# Patient Record
Sex: Female | Born: 1965 | Race: White | Hispanic: No | Marital: Married | Smoking: Former smoker
Health system: Southern US, Community
[De-identification: ages and names within clinical notes are randomized; demographics above are authoritative.]

## PROBLEM LIST (undated history)

## (undated) DIAGNOSIS — R55 Syncope and collapse: Secondary | ICD-10-CM

## (undated) DIAGNOSIS — F329 Major depressive disorder, single episode, unspecified: Secondary | ICD-10-CM

## (undated) DIAGNOSIS — E611 Iron deficiency: Secondary | ICD-10-CM

## (undated) DIAGNOSIS — M5412 Radiculopathy, cervical region: Secondary | ICD-10-CM

## (undated) DIAGNOSIS — R197 Diarrhea, unspecified: Secondary | ICD-10-CM

## (undated) DIAGNOSIS — M549 Dorsalgia, unspecified: Principal | ICD-10-CM

## (undated) DIAGNOSIS — Z86010 Personal history of colonic polyps: Secondary | ICD-10-CM

## (undated) DIAGNOSIS — K219 Gastro-esophageal reflux disease without esophagitis: Secondary | ICD-10-CM

## (undated) DIAGNOSIS — F32A Depression, unspecified: Secondary | ICD-10-CM

## (undated) DIAGNOSIS — R220 Localized swelling, mass and lump, head: Secondary | ICD-10-CM

## (undated) DIAGNOSIS — T7840XA Allergy, unspecified, initial encounter: Secondary | ICD-10-CM

## (undated) DIAGNOSIS — Z8719 Personal history of other diseases of the digestive system: Secondary | ICD-10-CM

## (undated) HISTORY — DX: Localized swelling, mass and lump, head: R22.0

## (undated) HISTORY — DX: Gastro-esophageal reflux disease without esophagitis: K21.9

## (undated) HISTORY — DX: Dorsalgia, unspecified: M54.9

## (undated) HISTORY — PX: COLONOSCOPY: SHX174

## (undated) HISTORY — PX: ABDOMINAL HYSTERECTOMY: SHX81

## (undated) HISTORY — PX: OTHER SURGICAL HISTORY: SHX169

## (undated) HISTORY — DX: Diarrhea, unspecified: R19.7

## (undated) HISTORY — DX: Personal history of colonic polyps: Z86.010

## (undated) HISTORY — DX: Personal history of other diseases of the digestive system: Z87.19

## (undated) HISTORY — DX: Iron deficiency: E61.1

## (undated) HISTORY — DX: Allergy, unspecified, initial encounter: T78.40XA

## (undated) HISTORY — DX: Major depressive disorder, single episode, unspecified: F32.9

## (undated) HISTORY — DX: Depression, unspecified: F32.A

## (undated) HISTORY — DX: Syncope and collapse: R55

## (undated) HISTORY — DX: Radiculopathy, cervical region: M54.12

---

## 2002-12-04 ENCOUNTER — Other Ambulatory Visit: Admission: RE | Admit: 2002-12-04 | Discharge: 2002-12-04 | Payer: Self-pay | Admitting: Obstetrics and Gynecology

## 2004-04-28 ENCOUNTER — Ambulatory Visit: Payer: Self-pay | Admitting: Family Medicine

## 2004-06-03 ENCOUNTER — Ambulatory Visit: Payer: Self-pay | Admitting: Family Medicine

## 2004-06-18 ENCOUNTER — Encounter: Admission: RE | Admit: 2004-06-18 | Discharge: 2004-06-18 | Payer: Self-pay | Admitting: Family Medicine

## 2004-07-29 ENCOUNTER — Ambulatory Visit: Payer: Self-pay | Admitting: Family Medicine

## 2005-01-06 ENCOUNTER — Ambulatory Visit: Payer: Self-pay | Admitting: Family Medicine

## 2005-05-10 ENCOUNTER — Ambulatory Visit: Payer: Self-pay | Admitting: Family Medicine

## 2005-07-29 ENCOUNTER — Other Ambulatory Visit: Admission: RE | Admit: 2005-07-29 | Discharge: 2005-07-29 | Payer: Self-pay | Admitting: Obstetrics and Gynecology

## 2005-12-26 ENCOUNTER — Ambulatory Visit: Payer: Self-pay | Admitting: Internal Medicine

## 2005-12-27 ENCOUNTER — Ambulatory Visit: Payer: Self-pay | Admitting: Family Medicine

## 2006-01-09 ENCOUNTER — Ambulatory Visit: Payer: Self-pay | Admitting: Family Medicine

## 2006-08-05 ENCOUNTER — Emergency Department (HOSPITAL_COMMUNITY): Admission: EM | Admit: 2006-08-05 | Discharge: 2006-08-05 | Payer: Self-pay | Admitting: Emergency Medicine

## 2007-01-17 ENCOUNTER — Telehealth: Payer: Self-pay | Admitting: Family Medicine

## 2007-01-17 ENCOUNTER — Ambulatory Visit: Payer: Self-pay | Admitting: Family Medicine

## 2007-01-17 DIAGNOSIS — R519 Headache, unspecified: Secondary | ICD-10-CM | POA: Insufficient documentation

## 2007-01-17 DIAGNOSIS — R002 Palpitations: Secondary | ICD-10-CM | POA: Insufficient documentation

## 2007-01-17 DIAGNOSIS — R51 Headache: Secondary | ICD-10-CM | POA: Insufficient documentation

## 2007-09-12 ENCOUNTER — Encounter: Admission: RE | Admit: 2007-09-12 | Discharge: 2007-09-12 | Payer: Self-pay | Admitting: Obstetrics and Gynecology

## 2007-09-24 ENCOUNTER — Ambulatory Visit: Payer: Self-pay | Admitting: Internal Medicine

## 2007-09-24 LAB — CONVERTED CEMR LAB
ALT: 18 units/L (ref 0–35)
BUN: 13 mg/dL (ref 6–23)
Basophils Absolute: 0 10*3/uL (ref 0.0–0.1)
CO2: 30 meq/L (ref 19–32)
Calcium: 9.4 mg/dL (ref 8.4–10.5)
Chloride: 107 meq/L (ref 96–112)
Cholesterol: 147 mg/dL (ref 0–200)
GFR calc Af Amer: 90 mL/min
Glucose, Bld: 104 mg/dL — ABNORMAL HIGH (ref 70–99)
HCT: 36.1 % (ref 36.0–46.0)
HDL: 36.5 mg/dL — ABNORMAL LOW (ref >39.0)
Hemoglobin: 12.5 g/dL (ref 12.0–15.0)
Ketones, ur: NEGATIVE mg/dL
Lymphocytes Relative: 27.1 % (ref 12.0–46.0)
MCHC: 34.5 g/dL (ref 30.0–36.0)
Potassium: 4 meq/L (ref 3.5–5.1)
RDW: 11.6 % (ref 11.5–14.6)
Sodium: 142 meq/L (ref 135–145)
Specific Gravity, Urine: 1.025 (ref 1.000–1.03)
TSH: 1.34 microintl units/mL (ref 0.35–5.50)
Total Bilirubin: 0.5 mg/dL (ref 0.3–1.2)
Urine Glucose: NEGATIVE mg/dL

## 2007-09-28 ENCOUNTER — Ambulatory Visit: Payer: Self-pay | Admitting: Internal Medicine

## 2007-09-28 DIAGNOSIS — Z860100 Personal history of colon polyps, unspecified: Secondary | ICD-10-CM | POA: Insufficient documentation

## 2007-09-28 DIAGNOSIS — R197 Diarrhea, unspecified: Secondary | ICD-10-CM

## 2007-09-28 DIAGNOSIS — L989 Disorder of the skin and subcutaneous tissue, unspecified: Secondary | ICD-10-CM | POA: Insufficient documentation

## 2007-09-28 HISTORY — DX: Personal history of colonic polyps: Z86.010

## 2007-09-28 HISTORY — DX: Diarrhea, unspecified: R19.7

## 2007-10-01 ENCOUNTER — Encounter (INDEPENDENT_AMBULATORY_CARE_PROVIDER_SITE_OTHER): Payer: Self-pay | Admitting: *Deleted

## 2007-10-08 ENCOUNTER — Ambulatory Visit: Payer: Self-pay | Admitting: Gastroenterology

## 2007-10-08 LAB — CONVERTED CEMR LAB: IgA: 254 mg/dL (ref 68–378)

## 2007-10-10 LAB — CONVERTED CEMR LAB: Tissue Transglutaminase Ab, IgA: 0.2 units (ref <7)

## 2007-10-15 ENCOUNTER — Ambulatory Visit: Payer: Self-pay | Admitting: Gastroenterology

## 2007-10-15 ENCOUNTER — Encounter: Payer: Self-pay | Admitting: Gastroenterology

## 2007-10-16 ENCOUNTER — Encounter: Payer: Self-pay | Admitting: Gastroenterology

## 2007-11-21 ENCOUNTER — Ambulatory Visit: Payer: Self-pay | Admitting: Gastroenterology

## 2007-11-30 ENCOUNTER — Telehealth: Payer: Self-pay | Admitting: Gastroenterology

## 2007-12-04 ENCOUNTER — Telehealth (INDEPENDENT_AMBULATORY_CARE_PROVIDER_SITE_OTHER): Payer: Self-pay | Admitting: *Deleted

## 2007-12-28 ENCOUNTER — Ambulatory Visit: Payer: Self-pay | Admitting: Internal Medicine

## 2008-06-12 HISTORY — PX: OTHER SURGICAL HISTORY: SHX169

## 2008-06-19 ENCOUNTER — Encounter (INDEPENDENT_AMBULATORY_CARE_PROVIDER_SITE_OTHER): Payer: Self-pay | Admitting: General Surgery

## 2008-06-19 ENCOUNTER — Ambulatory Visit (HOSPITAL_COMMUNITY): Admission: RE | Admit: 2008-06-19 | Discharge: 2008-06-19 | Payer: Self-pay | Admitting: General Surgery

## 2008-09-12 ENCOUNTER — Encounter: Admission: RE | Admit: 2008-09-12 | Discharge: 2008-09-12 | Payer: Self-pay | Admitting: Obstetrics and Gynecology

## 2008-10-06 ENCOUNTER — Ambulatory Visit: Payer: Self-pay | Admitting: Internal Medicine

## 2008-10-06 LAB — CONVERTED CEMR LAB
ALT: 15 units/L (ref 0–35)
AST: 18 units/L (ref 0–37)
Albumin: 3.4 g/dL — ABNORMAL LOW (ref 3.5–5.2)
Alkaline Phosphatase: 38 units/L — ABNORMAL LOW (ref 39–117)
Basophils Relative: 0.4 % (ref 0.0–3.0)
Bilirubin Urine: NEGATIVE
CO2: 26 meq/L (ref 19–32)
Creatinine, Ser: 0.9 mg/dL (ref 0.4–1.2)
Eosinophils Absolute: 0.1 10*3/uL (ref 0.0–0.7)
Eosinophils Relative: 0.7 % (ref 0.0–5.0)
GFR calc non Af Amer: 73.35 mL/min (ref >60)
Glucose, Bld: 99 mg/dL (ref 70–99)
HCT: 36.6 % (ref 36.0–46.0)
Hemoglobin, Urine: NEGATIVE
Hemoglobin: 12 g/dL (ref 12.0–15.0)
Ketones, ur: NEGATIVE mg/dL
LDL Cholesterol: 83 mg/dL (ref 0–99)
Leukocytes, UA: NEGATIVE
Neutro Abs: 5.2 10*3/uL (ref 1.4–7.7)
Platelets: 277 10*3/uL (ref 150.0–400.0)
Potassium: 4.2 meq/L (ref 3.5–5.1)
Specific Gravity, Urine: 1.025 (ref 1.000–1.030)
TSH: 1.37 microintl units/mL (ref 0.35–5.50)
Total CHOL/HDL Ratio: 4
Total Protein: 6.8 g/dL (ref 6.0–8.3)
Triglycerides: 111 mg/dL (ref 0.0–149.0)
Urobilinogen, UA: 0.2 (ref 0.0–1.0)

## 2008-10-08 ENCOUNTER — Ambulatory Visit: Payer: Self-pay | Admitting: Internal Medicine

## 2009-05-25 ENCOUNTER — Ambulatory Visit: Payer: Self-pay | Admitting: Internal Medicine

## 2009-05-25 DIAGNOSIS — M5412 Radiculopathy, cervical region: Secondary | ICD-10-CM

## 2009-05-25 HISTORY — DX: Radiculopathy, cervical region: M54.12

## 2009-09-15 ENCOUNTER — Encounter: Admission: RE | Admit: 2009-09-15 | Discharge: 2009-09-15 | Payer: Self-pay | Admitting: Obstetrics and Gynecology

## 2009-10-06 ENCOUNTER — Ambulatory Visit: Payer: Self-pay | Admitting: Internal Medicine

## 2009-10-06 LAB — CONVERTED CEMR LAB
ALT: 34 units/L (ref 0–35)
AST: 25 units/L (ref 0–37)
Bilirubin, Direct: 0.1 mg/dL (ref 0.0–0.3)
Hemoglobin, Urine: NEGATIVE
Ketones, ur: NEGATIVE mg/dL
Specific Gravity, Urine: 1.02 (ref 1.000–1.030)
Total Bilirubin: 0.7 mg/dL (ref 0.3–1.2)
Total Protein, Urine: NEGATIVE mg/dL
Total Protein: 7.5 g/dL (ref 6.0–8.3)
Urine Glucose: NEGATIVE mg/dL
Urobilinogen, UA: 0.2 (ref 0.0–1.0)

## 2009-10-09 ENCOUNTER — Encounter: Payer: Self-pay | Admitting: Internal Medicine

## 2009-10-09 ENCOUNTER — Ambulatory Visit: Payer: Self-pay | Admitting: Internal Medicine

## 2009-10-13 ENCOUNTER — Ambulatory Visit (HOSPITAL_COMMUNITY): Admission: RE | Admit: 2009-10-13 | Discharge: 2009-10-14 | Payer: Self-pay | Admitting: Obstetrics and Gynecology

## 2009-10-13 ENCOUNTER — Encounter (INDEPENDENT_AMBULATORY_CARE_PROVIDER_SITE_OTHER): Payer: Self-pay | Admitting: Obstetrics and Gynecology

## 2009-12-26 ENCOUNTER — Ambulatory Visit: Payer: Self-pay | Admitting: Family Medicine

## 2010-02-16 ENCOUNTER — Ambulatory Visit: Payer: Self-pay | Admitting: Internal Medicine

## 2010-02-16 DIAGNOSIS — J019 Acute sinusitis, unspecified: Secondary | ICD-10-CM

## 2010-03-09 ENCOUNTER — Ambulatory Visit
Admission: RE | Admit: 2010-03-09 | Discharge: 2010-03-09 | Payer: Self-pay | Source: Home / Self Care | Attending: Internal Medicine | Admitting: Internal Medicine

## 2010-03-09 DIAGNOSIS — H6690 Otitis media, unspecified, unspecified ear: Secondary | ICD-10-CM | POA: Insufficient documentation

## 2010-03-09 DIAGNOSIS — H6980 Other specified disorders of Eustachian tube, unspecified ear: Secondary | ICD-10-CM

## 2010-04-13 NOTE — Assessment & Plan Note (Signed)
Summary: PHYSICAL  STC   Vital Signs:  Patient profile:   45 year old female Height:      62 inches Weight:      139 pounds BMI:     25.52 O2 Sat:      97 % on Room air Temp:     98.2 degrees F oral Pulse rate:   75 / minute BP sitting:   90 / 68  (left arm) Cuff size:   regular  Vitals Entered By: Zella Ball Ewing CMA Duncan Dull) (October 09, 2009 8:58 AM)  O2 Flow:  Room air  Preventive Care Screening  Mammogram:    Date:  09/15/2009    Results:  normal   CC: Adult Physical/RE   Primary Care Provider:  Oliver Barre, MD  CC:  Adult Physical/RE.  History of Present Illness: here to f/u - for vaginal hyst next wk due to fibroids;  Pt denies CP, sob, doe, wheezing, orthopnea, pnd, worsening LE edema, palps, dizziness or syncope  Pt denies new neuro symptoms such as headache, facial or extremity weakness  No fever, wt loss, night sweats, loss of appetite or other constitutional symptoms   Problems Prior to Update: 1)  Cervical Radiculopathy  (ICD-723.4) 2)  Skin Lesion  (ICD-709.9) 3)  Preventive Health Care  (ICD-V70.0) 4)  Diarrhea  (ICD-787.91) 5)  Colonic Polyps, Hx of  (ICD-V12.72) 6)  Palpitations  (ICD-785.1) 7)  Headache, Sinus  (ICD-784.0)  Medications Prior to Update: 1)  Colestid 1 Gm  Tabs (Colestipol Hcl) .... Take One Pill, Once Daily 2)  Azithromycin 250 Mg Tabs (Azithromycin) .... 2po Qd For 1 Day, Then 1po Qd For 4days, Then Stop 3)  Promethazine-Codeine 6.25-10 Mg/25ml Syrp (Promethazine-Codeine) .Marland Kitchen.. 1 Tsp By Mouth Q 6 Hrs As Needed Cough 4)  Dairy Digestive Supplement 3000 Unit Tabs (Lactase) .Marland Kitchen.. 1 By Mouth As Needed  Current Medications (verified): 1)  Colestid 1 Gm  Tabs (Colestipol Hcl) .... Take One Pill, Once Daily 2)  Dairy Digestive Supplement 3000 Unit Tabs (Lactase) .Marland Kitchen.. 1 By Mouth As Needed  Allergies (verified): No Known Drug Allergies  Past History:  Past Medical History: Last updated: 10/08/2007 Colonic polyps, hx of  chronic recurrent  diarrhea - ? 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         all otherwise negative per pt -    Physical Exam  General:  alert and well-developed.   Head:  normocephalic and atraumatic.   Eyes:  vision grossly intact, pupils equal, and pupils round.   Ears:  R ear  normal and L ear normal.   Nose:  no external deformity and no nasal discharge.   Mouth:  no gingival abnormalities and pharynx pink and moist.   Neck:  supple and no masses.   Lungs:  normal respiratory effort and normal breath sounds.   Heart:  normal rate and regular rhythm.   Abdomen:  soft, non-tender, and normal bowel sounds.   Msk:  no joint tenderness and no joint swelling.   Extremities:  no edema, no erythema  Neurologic:  cranial nerves II-XII intact and strength normal in all extremities.     Skin:  color normal and no rashes.   Psych:  not anxious appearing and not depressed appearing.     Impression & Recommendations:  Problem # 1:  Preventive Health Care (ICD-V70.0)  Overall doing well, age appropriate education and counseling updated and referral for appropriate preventive services done unless declined, immunizations up to date or declined, diet counseling done if overweight, urged to quit smoking if smokes , most recent labs reviewed and current ordered if appropriate, ecg reviewed or declined (interpretation per ECG scanned in the EMR if done); information regarding Medicare Prevention requirements given if appropriate; speciality referrals updated as appropriate   Orders: EKG w/ Interpretation (93000)  Complete Medication List: 1)  Colestid 1 Gm Tabs (Colestipol hcl) .... Take one pill, once daily 2)  Dairy Digestive Supplement 3000 Unit Tabs (Lactase) .Marland Kitchen.. 1 by mouth as needed  Patient Instructions: 1)  Continue all previous medications as before this visit  2)  You are given the copy of your EKG and lab work 3)  Daisey Must with your surgury 4)  Please schedule a follow-up appointment in 1 year

## 2010-04-13 NOTE — Assessment & Plan Note (Signed)
Summary: cough/sore throat/john/cd   Vital Signs:  Patient profile:   45 year old female Weight:      145 pounds Temp:     99.1 degrees F oral Pulse rate:   68 / minute Pulse rhythm:   regular BP sitting:   104 / 76  (right arm) Cuff size:   regular  Vitals Entered By: Lowella Petties CMA (December 26, 2009 9:10 AM) CC: Cough, sore throat, congestion x 1 week   Primary Care Provider:  Oliver Barre, MD   History of Present Illness:  45 year old female with a productive cough, subjective feeling of fever, chills,  cough productive of colored sputum, sore throat, that is unresolved in  7-10 days. No nausea, vomiting, or diarrhea. Severe cough, keeping her up at night. She does work in [employer] of one of the [employer].  No smoking. No UTI symptoms. No rash.  ROS: As above, no genitourinary complaints or gynecological complaints.  No chest pain. No polyuria or dysuria.  GEN: A and O x 3. WDWN. NAD.    ENT: Nose clear, ext NML.  No LAD.  No JVD.  TM's clear. Oropharynx clear.  PULM: Normal WOB, no distress. No crackles, wheezes, rhonchi. CV: RRR, no M/G/R, No rubs, No JVD.   ABD: S, NT, ND, + BS. No rebound. No guarding. No HSM.   EXT: warm and well-perfused, No c/c/e. PSYCH: Pleasant and conversant.   Allergies: No Known Drug Allergies  Past History:  Past medical, surgical, family and social histories (including risk factors) reviewed, and no changes noted (except as noted below).  Past Medical History: Reviewed history from 10/08/2007 and no changes required. Colonic polyps, hx of  chronic recurrent diarrhea - ? etiology  Past Surgical History: Reviewed history from 10/08/2008 and no changes required. 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 mother with GYN cancer 3 aunt and grandmother with breast cancer grandmother with CAD/MI No FH of Colon Cancer:  Social  History: Reviewed history from 10/09/2009 and no changes required. 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 1 - 2 tsp by mouth at bedtime as needed cough    Azithromycin 250 Mg Tabs (Azithromycin) .Marland Kitchen... 2 by  mouth today and then 1 daily for 4 days  Problem # 2:  COUGH (ICD-786.2)  Complete Medication List: 1)  Colestid 1 Gm Tabs (Colestipol hcl) .... Take one pill, once daily 2)  Dairy Digestive Supplement 3000 Unit Tabs (Lactase) .Marland Kitchen.. 1 by mouth as needed 3)  Hydrocodone-homatropine 5-1.5 Mg Tabs (Hydrocodone-homatropine) .Marland Kitchen.. 1 - 2 tsp by mouth at bedtime as needed cough 4)  Azithromycin 250 Mg Tabs (Azithromycin) .... 2 by  mouth today and then 1 daily for 4 days  Patient Instructions: 1)  BRONCHITIS 2)  -Viral or baterial infections of the lung. Fever, cough, chest pain, shortness of breath, phlegm production, fatigue are symptoms. 3)  Treatment: 4)  1. Take all medicines 5)  2. Antibiotics  6)  3. Cough suppressants 7)  4. Bronchodilators: an inhaler 8)  5. Expectorant like Guaifenesin (Robitussin, Mucinex) 9)  Fluids and Moisture help: drink lots of fluids 10)  Vaporizier or humidifier in room, shower steam 11)  --help loosen secretions and sooth breathing passages 12)  Elevate head slightly when trying to sleep.  Prescriptions: AZITHROMYCIN 250 MG  TABS (AZITHROMYCIN) 2 by  mouth today and then 1 daily for 4 days  #6 x 0   Entered and Authorized by:   Hannah Beat MD   Signed by:   Hannah Beat MD on 12/26/2009   Method used:   Print then Give to Patient   RxID:    [id number] HYDROCODONE-HOMATROPINE 5-1.5 MG TABS (HYDROCODONE-HOMATROPINE) 1 - 2 tsp by mouth at bedtime as needed cough  #240 mL x 0   Entered and Authorized by:   Hannah Beat MD   Signed by:   Hannah Beat MD on 12/26/2009   Method used:   Print then Give to Patient   RxID:   [id number]    Orders Added: 1)  Est. Patient Level IV [84696]

## 2010-04-13 NOTE — Assessment & Plan Note (Signed)
Summary: PLACE ON FACE/NWS  #   Vital Signs:  Patient profile:   45 year old female Height:      62 inches Weight:      151.25 pounds BMI:     27.76 O2 Sat:      98 % on Room air Temp:     98.5 degrees F oral Pulse rate:   70 / minute BP sitting:   102 / 72  (left arm) Cuff size:   regular  Vitals Entered ByZella Ball Ewing (May 25, 2009 4:03 PM)  O2 Flow:  Room air CC: spot on face/RE   Primary Care Provider:  Oliver Barre, MD  CC:  spot on face/RE.  History of Present Illness: here with a new lesion to the area of the face just in front of the right tragus which seems to have come up in the past  2 months and a couple of co-workers have had facial skin cancers removed recently;   also c/o one episode right neck pain with radiation asssoc with spasm like pain to the distal RUE with numbness and weakness that lasted for a minute only a few months ago, that has not recurred; was quite dramatic to her and occurred while eating using the right hand lifting the fork to the mouth and flexing the neck;  has not recurred since.  Problems Prior to Update: 1)  Skin Lesion  (ICD-709.9) 2)  Preventive Health Care  (ICD-V70.0) 3)  Diarrhea  (ICD-787.91) 4)  Colonic Polyps, Hx of  (ICD-V12.72) 5)  Palpitations  (ICD-785.1) 6)  Headache, Sinus  (ICD-784.0)  Medications Prior to Update: 1)  Colestid 1 Gm  Tabs (Colestipol Hcl) .... Take One Pill, Once Daily 2)  Azithromycin 250 Mg Tabs (Azithromycin) .... 2po Qd For 1 Day, Then 1po Qd For 4days, Then Stop 3)  Promethazine-Codeine 6.25-10 Mg/38ml Syrp (Promethazine-Codeine) .Marland Kitchen.. 1 Tsp By Mouth Q 6 Hrs As Needed Cough 4)  Dairy Digestive Supplement 3000 Unit Tabs (Lactase) .Marland Kitchen.. 1 By Mouth As Needed  Current Medications (verified): 1)  Colestid 1 Gm  Tabs (Colestipol Hcl) .... Take One Pill, Once Daily 2)  Azithromycin 250 Mg Tabs (Azithromycin) .... 2po Qd For 1 Day, Then 1po Qd For 4days, Then Stop 3)  Promethazine-Codeine 6.25-10 Mg/82ml  Syrp (Promethazine-Codeine) .Marland Kitchen.. 1 Tsp By Mouth Q 6 Hrs As Needed Cough 4)  Dairy Digestive Supplement 3000 Unit Tabs (Lactase) .Marland Kitchen.. 1 By Mouth As Needed  Allergies (verified): No Known Drug Allergies  Past History:  Past Medical History: Last updated: 10/08/2007 Colonic polyps, hx of  chronic recurrent diarrhea - ? 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   Head:  normocephalic and atraumatic.   Eyes:  vision grossly intact, pupils equal, and pupils round.   Ears:  R ear normal and L ear normal.   Nose:  no external deformity and no nasal discharge.   Mouth:  no gingival abnormalities and pharynx pink and moist.   Neck:  supple and no masses.   Lungs:  normal respiratory effort and normal breath sounds.   Heart:  normal rate  and regular rhythm.   Extremities:  no edema, no erythema  Neurologic:  cranial nerves II-XII intact and strength normal in upper extremities.   Skin:  approx 8 mm slight raised tan regular lesion noted just ant to the right tragus, nontender, noninflamed , nonulcerated- appears benign   Impression & Recommendations:  Problem # 1:  SKIN LESION (ICD-709.9) c/w benign lesion - ok to follow, consider derm if changes  Problem # 2:  CERVICAL RADICULOPATHY (ICD-723.4) very transient most likely positional irriation of cervical nerve - d/w pt, ok to follow for now, exam bening, to follow for any recurrence and possible need for further eval such as MRI  Complete Medication List: 1)  Colestid 1 Gm Tabs (Colestipol hcl) .... Take one pill, once daily 2)  Azithromycin  250 Mg Tabs (Azithromycin) .... 2po qd for 1 day, then 1po qd for 4days, then stop 3)  Promethazine-codeine 6.25-10 Mg/81ml Syrp (Promethazine-codeine) .Marland Kitchen.. 1 tsp by mouth q 6 hrs as needed cough 4)  Dairy Digestive Supplement 3000 Unit Tabs (Lactase) .Marland Kitchen.. 1 by mouth as needed  Patient Instructions: 1)  Continue all previous medications as before this visit  2)  please call or return with any further neck problems 3)  Please schedule a follow-up appointment in 4 months with CPX labs

## 2010-04-13 NOTE — Assessment & Plan Note (Signed)
Summary: SORE THROAT/EAR PAIN/HEADACHE-LB   Vital Signs:  Patient profile:   45 year old female Height:      62 inches (157.48 cm) Weight:      145 pounds (65.91 kg) O2 Sat:      97 % on Room air Temp:     98.3 degrees F (36.83 degrees C) oral Pulse rate:   85 / minute BP sitting:   100 / 62  (left arm) Cuff size:   regular  Vitals Entered By: Orlan Leavens RMA (February 16, 2010 1:17 PM)  O2 Flow:  Room air CC: Sore throat/ ear pain/ headache, URI symptoms Is Patient Diabetic? No Pain Assessment Patient in pain? yes     Location: ear pain Type: aching   Primary Care Provider:  Oliver Barre, MD  CC:  Sore throat/ ear pain/ headache and URI symptoms.  History of Present Illness:  URI Symptoms      This is a 45 year old woman who presents with URI symptoms.  The symptoms began 4 days ago.  The severity is described as moderate.  similar to priot sinus infx symptoms.  The patient reports nasal congestion, purulent nasal discharge, sore throat, dry cough, earache, and sick contacts, but denies productive cough.  Associated symptoms include low-grade fever (<100.5 degrees).  The patient denies stiff neck, dyspnea, wheezing, rash, vomiting, and diarrhea.  The patient also reports headache and severe fatigue.  The patient denies itchy watery eyes, sneezing, and seasonal symptoms.  The patient denies the following risk factors for Strep sinusitis: tooth pain, Strep exposure, and tender adenopathy.    Current Medications (verified): 1)  None  Allergies (verified): No Known Drug Allergies  Past History:  Past Medical History: Colonic polyps, hx of  chronic recurrent diarrhea , ? etiology  Review of Systems       The patient complains of hoarseness.  The patient denies weight loss, decreased hearing, chest pain, and syncope.    Physical Exam  General:  alert, well-developed, well-nourished, and cooperative to examination.   mildly ill and scratchy throat Eyes:  vision grossly  intact; pupils equal, round and reactive to light.  conjunctiva and lids normal.    Ears:  L>R TM erythema, serous effusion on L, hearing grossly norm Mouth:  teeth and gums in good repair; mucous membranes moist, without lesions or ulcers. oropharynx clear without exudate, mod erythema.  Lungs:  normal respiratory effort, no intercostal retractions or use of accessory muscles; normal breath sounds bilaterally - no crackles and no wheezes.    Heart:  normal rate, regular rhythm, no murmur, and no rub. BLE without edema. Neurologic:  alert & oriented X3 and cranial nerves II-XII symetrically intact.  strength normal in all extremities, sensation intact to light touch, and gait normal. speech fluent without dysarthria or aphasia; follows commands with good comprehension.    Impression & Recommendations:  Problem # 1:  ACUTE SINUSITIS, UNSPECIFIED (ICD-461.9)  The following medications were removed from the medication list:    Hydrocodone-homatropine 5-1.5 Mg Tabs (Hydrocodone-homatropine) .Marland Kitchen... 1 - 2 tsp by mouth at bedtime as needed cough Her updated medication list for this problem includes:    Augmentin 875-125 Mg Tabs (Amoxicillin-pot clavulanate) .Marland Kitchen... 1 by mouth two times a day x 7days    Tessalon Perles 100 Mg Caps (Benzonatate) .Marland Kitchen... 1 by mouth three times a day x 5days, then as needed for cough  Instructed on treatment. Call if symptoms persist or worsen.   Complete Medication List:  1)  Augmentin 875-125 Mg Tabs (Amoxicillin-pot clavulanate) .Marland Kitchen.. 1 by mouth two times a day x 7days 2)  Tessalon Perles 100 Mg Caps (Benzonatate) .Marland Kitchen.. 1 by mouth three times a day x 5days, then as needed for cough  Patient Instructions: 1)  it was good to see you today. 2)  augmentin and tessalon for sinus and cough symptoms - your prescriptions have been electronically submitted to your pharmacy. Please take as directed. Contact our office if you believe you're having problems with the medication(s).    3)  Get plenty of rest, drink lots of clear liquids, and use Tylenol or Ibuprofen for fever and comfort. Return in 7-10 days if you're not better:sooner if you're feeling worse. Prescriptions: TESSALON PERLES 100 MG CAPS (BENZONATATE) 1 by mouth three times a day x 5days, then as needed for cough  #30 x 0   Entered and Authorized by:   Newt Lukes MD   Signed by:   Newt Lukes MD on 02/16/2010   Method used:   Electronically to        [facility] [id number] (retail)       [address]       [place], [place]  [address]       Ph: [phone] or [phone]       Fax: [phone]   RxID:   [id number] AUGMENTIN 875-125 MG TABS (AMOXICILLIN-POT CLAVULANATE) 1 by mouth two times a day x 7days  #14 x 0   Entered and Authorized by:   Newt Lukes MD   Signed by:   Newt Lukes MD on 02/16/2010   Method used:   [facility]        [facility] [id number]retail[facility]       [address]       [place], [place]  [address]       Ph: [phone] or [phone]       Fax: [phone]   RxID:   [id number]    Orders Added: 1)  Est. Patient Level IV [42706]

## 2010-04-15 NOTE — Assessment & Plan Note (Signed)
Summary: ?still sinus inf,worsening sx's/cd   Vital Signs:  Patient profile:   45 year old female Height:      62 inches Weight:      147.13 pounds BMI:     27.01 O2 Sat:      98 % on Room air Temp:     98.8 degrees F oral Pulse rate:   84 / minute BP sitting:   100 / 72  (left arm) Cuff size:   regular  Vitals Entered By: Zella Ball Ewing CMA Duncan Dull) (March 09, 2010 3:53 PM)  O2 Flow:  Room air CC: Sinus congestion, left ear pain/RE   Primary Care Provider:  Oliver Barre, MD  CC:  Sinus congestion and left ear pain/RE.  History of Present Illness: here overall improved from  last visit with essentaily resolution of sinus pain/pressure , but unfortuately in the past wk has had increaessed left earpain, ongoing feeling warm and popping noises, but fortunately no vertigo, n/v, dizziness, blurred vision or further facial pain.  No chills, ST, cough adn Pt denies CP, worsening sob, doe, wheezing, orthopnea, pnd, worsening LE edema, palps, dizziness or syncope.  Pt denies new neuro symptoms such as headache, facial or extremity weakness Pt denies polydipsia, polyuria   Overall good compliance with meds, trying to follow low chol diet, wt stable  Problems Prior to Update: 1)  Other Disorders of Eustachian Tube  (ICD-381.89) 2)  Otitis Media, Left  (ICD-382.9) 3)  Acute Sinusitis, Unspecified  (ICD-461.9) 4)  Cervical Radiculopathy  (ICD-723.4) 5)  Skin Lesion  (ICD-709.9) 6)  Preventive Health Care  (ICD-V70.0) 7)  Diarrhea  (ICD-787.91) 8)  Colonic Polyps, Hx of  (ICD-V12.72) 9)  Palpitations  (ICD-785.1) 10)  Headache, Sinus  (ICD-784.0)  Medications Prior to Update: 1)  None  Current Medications (verified): 1)  Levofloxacin 500 Mg Tabs (Levofloxacin) .Marland Kitchen.. 1po Once Daily  Allergies (verified): No Known Drug Allergies  Past History:  Past Medical History: Last updated: 02/16/2010 Colonic polyps, hx of  chronic recurrent diarrhea , ? 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   mildly ill Head:  normocephalic and atraumatic.   Eyes:  vision grossly intact; pupils equal, round and reactive to light.  conjunctiva and lids normal.    Ears:  L>R TM erythema, serous effusion on L, hearing grossly norm Nose:  no external deformity and no nasal discharge.   Mouth:  pharyngeal erythema and fair dentition.   Neck:  supple and no masses.   Lungs:  normal respiratory effort and normal breath sounds.   Heart:  normal rate and regular rhythm.   Extremities:  no edema, no erythema  Neurologic:  gait normal and finger-to-nose normal.     Impression & Recommendations:  Problem # 1:  OTITIS MEDIA, LEFT (ICD-382.9) Assessment Deteriorated  Her updated medication list for this problem includes:    Levofloxacin 500 Mg Tabs (Levofloxacin) .Marland Kitchen... 1po once daily treat as above, f/u any worsening signs or symptoms   Problem # 2:  OTHER DISORDERS OF EUSTACHIAN TUBE (ICD-381.89) Assessment: Deteriorated for mucinex  and low dose sudafed as needed   Problem # 3:  ACUTE SINUSITIS, UNSPECIFIED (ICD-461.9) improved - no evidence today; no further tx or eval for this part of the problem  Complete Medication List: 1)  Levofloxacin 500 Mg Tabs (Levofloxacin) .Marland Kitchen.. 1po once daily  Patient Instructions: 1)  Please take all new medications as prescribed 2)  You can also use Mucinex OTC or it's generic for congestion , and sudafed OTC 30 mg q 6 hrs as needed (watch for insomnia though if take too close to bedtime) 3)  Continue all previous  medications as before this visit  4)  Please schedule a follow-up appointment as needed. Prescriptions: LEVOFLOXACIN 500 MG TABS (LEVOFLOXACIN) 1po once daily  #10 x 0   Entered and Authorized by:   Corwin Levins MD   Signed by:   Corwin Levins MD on 03/09/2010   Method used:   Print then Give to Patient   RxID:   [id number]    Orders Added: 1)  Est. Patient Level IV [65784]

## 2010-05-19 ENCOUNTER — Encounter: Payer: Self-pay | Admitting: Internal Medicine

## 2010-05-19 ENCOUNTER — Ambulatory Visit (INDEPENDENT_AMBULATORY_CARE_PROVIDER_SITE_OTHER): Payer: BC Managed Care – PPO | Admitting: Internal Medicine

## 2010-05-19 DIAGNOSIS — M549 Dorsalgia, unspecified: Secondary | ICD-10-CM

## 2010-05-19 DIAGNOSIS — L989 Disorder of the skin and subcutaneous tissue, unspecified: Secondary | ICD-10-CM

## 2010-05-19 HISTORY — DX: Dorsalgia, unspecified: M54.9

## 2010-05-25 NOTE — Assessment & Plan Note (Signed)
Summary: BACK PAIN   STC   Vital Signs:  Patient profile:   45 year old female Height:      62 inches Weight:      146 pounds BMI:     26.80 O2 Sat:      98 % on Room air Temp:     98.6 degrees F oral Pulse rate:   68 / minute BP sitting:   102 / 70  (left arm) Cuff size:   regular  Vitals Entered By: Zella Ball Ewing CMA Duncan Dull) (May 19, 2010 4:51 PM)  O2 Flow:  Room air CC: Back pain/RE   Primary Care Provider:  Oliver Barre, MD  CC:  Back pain/RE.  History of Present Illness: here with right lower bakc pain, acute onset x 3 wks, mild to mod but with occasional severe, radiates from the right back to the right lateral hip area to the right pelvis area, intermittent, not assoc with bowel or bladder change, no LE pain/weak/numb, gait change or fall.  No abd pain, n/v, fever, wt loss.  Nothing seems to make worse, using BC powders to help somewhat. No prior hx, no MRI, surgoical eval, ESI treatments or PT.  Does also have a "knot" to the right buttock that has been presnet for yrs without change but for reason seems more tender now to palpate, but no skin change over it realted such as erythema, fluctucance or drainage.    Problems Prior to Update: 1)  Skin Lesion  (ICD-709.9) 2)  Back Pain  (ICD-724.5) 3)  Other Disorders of Eustachian Tube  (ICD-381.89) 4)  Otitis Media, Left  (ICD-382.9) 5)  Acute Sinusitis, Unspecified  (ICD-461.9) 6)  Cervical Radiculopathy  (ICD-723.4) 7)  Skin Lesion  (ICD-709.9) 8)  Preventive Health Care  (ICD-V70.0) 9)  Diarrhea  (ICD-787.91) 10)  Colonic Polyps, Hx of  (ICD-V12.72) 11)  Palpitations  (ICD-785.1) 12)  Headache, Sinus  (ICD-784.0)  Medications Prior to Update: 1)  Levofloxacin 500 Mg Tabs (Levofloxacin) .Marland Kitchen.. 1po Once Daily  Current Medications (verified): 1)  Tramadol Hcl 100 Mg Xr24h-Tab (Tramadol Hcl) .Marland Kitchen.. 1 By Mouth Once Daily 2)  Prednisone 10 Mg Tabs (Prednisone) .... 3po Qd For 3days, Then 2po Qd For 3days, Then 1po Qd For  3days, Then Stop 3)  Flexeril 5 Mg Tabs (Cyclobenzaprine Hcl) .Marland Kitchen.. 1po Three Times A Day As Needed  Allergies (verified): No Known Drug Allergies  Past History:  Past Medical History: Last updated: 02/16/2010 Colonic polyps, hx of  chronic recurrent diarrhea , ? 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  Head:  normocephalic and atraumatic.   Eyes:  vision grossly intact; pupils equal, round and reactive to light.  conjunctiva and lids normal.    Ears:  R ear normal and L ear normal.  , canals clear, sinus nontender Nose:  no external deformity and no nasal discharge.   Mouth:  no gingival abnormalities and pharynx pink and moist.   Neck:  supple and no masses.   Lungs:  normal respiratory effort and normal breath sounds.  Heart:  normal rate and regular rhythm.   Abdomen:  soft, non-tender, and normal bowel sounds.   Msk:  no joint tenderness and no joint swelling.  , spine nontender, but has right lateral l4-5 level muscular tenderness and spasm Extremities:  no edema, no erythema  Neurologic:  strength normal in all extremities, sensation intact to light touch, gait normal, and DTRs symmetrical and normal.   Skin:  color normal and no rashes.  , does have subq lesion right buttock < 1 cm, regualr, firm but not hard and mobile, nontender to palpate, noncystic and nonfluctuant Psych:  not depressed appearing and slightly anxious.     Impression & Recommendations:  Problem # 1:  BACK PAIN (ICD-724.5)  Her updated medication list for  this problem includes:    Tramadol Hcl 100 Mg Xr24h-tab (Tramadol hcl) .Marland Kitchen... 1 by mouth once daily    Flexeril 5 Mg Tabs (Cyclobenzaprine hcl) .Marland Kitchen... 1po three times a day as needed suspect MSK strain   - treat as above, f/u any worsening signs or symptoms, exam o/w bengn but if persists > total 4 wks would consider MRI,  also for predpak today  Problem # 2:  SKIN LESION (ICD-709.9) subq nodule right buttock, not likely assoc with above, prob benign such as dermatofibroma, ok to follow for now  Complete Medication List: 1)  Tramadol Hcl 100 Mg Xr24h-tab (Tramadol hcl) .Marland Kitchen.. 1 by mouth once daily 2)  Prednisone 10 Mg Tabs (Prednisone) .... 3po qd for 3days, then 2po qd for 3days, then 1po qd for 3days, then stop 3)  Flexeril 5 Mg Tabs (Cyclobenzaprine hcl) .Marland Kitchen.. 1po three times a day as needed  Patient Instructions: 1)  Please take all new medications as prescribed 2)  Continue all previous medications as before this visit  3)  Please call if worse, or in one wk if not improved to consider MRI for the lower back 4)  Please schedule a follow-up appointment in 4 months for CPX with labs Prescriptions: FLEXERIL 5 MG TABS (CYCLOBENZAPRINE HCL) 1po three times a day as needed  #60 x 1   Entered and Authorized by:   Corwin Levins MD   Signed by:   Corwin Levins MD on 05/19/2010   Method used:   Print then Give to Patient   RxID:   [id number] PREDNISONE 10 MG TABS (PREDNISONE) 3po qd for 3days, then 2po qd for 3days, then 1po qd for 3days, then stop  #18 x 0   Entered and Authorized by:   Corwin Levins MD   Signed by:   Corwin Levins MD on 05/19/2010   Method used:   Print then Give to Patient   RxID:   [id number] TRAMADOL HCL 100 MG XR24H-TAB (TRAMADOL HCL) 1 by mouth once daily  #30 x 1   Entered and Authorized by:   Corwin Levins MD   Signed by:   Corwin Levins MD on 05/19/2010   Method used:   Print then Give to Patient   RxID:   [id number]    Orders Added: 1)  Est. Patient  Level III [56433]

## 2010-05-28 LAB — CBC
HCT: 30.5 % — ABNORMAL LOW (ref 36.0–46.0)
MCH: 32.2 pg (ref 26.0–34.0)
MCHC: 34.9 g/dL (ref 30.0–36.0)
Platelets: 249 10*3/uL (ref 150–400)
WBC: 11.9 10*3/uL — ABNORMAL HIGH (ref 4.0–10.5)

## 2010-05-28 LAB — BASIC METABOLIC PANEL
BUN: 4 mg/dL — ABNORMAL LOW (ref 6–23)
CO2: 28 mEq/L (ref 19–32)
GFR calc non Af Amer: >60 mL/min (ref >60)
Glucose, Bld: 102 mg/dL — ABNORMAL HIGH (ref 70–99)
Potassium: 3.5 mEq/L (ref 3.5–5.1)

## 2010-05-29 LAB — BASIC METABOLIC PANEL
CO2: 28 mEq/L (ref 19–32)
GFR calc Af Amer: >60 mL/min (ref >60)
GFR calc non Af Amer: >60 mL/min (ref >60)
Glucose, Bld: 94 mg/dL (ref 70–99)
Sodium: 136 mEq/L (ref 135–145)

## 2010-05-29 LAB — CBC
HCT: 37.8 % (ref 36.0–46.0)
MCH: 31.7 pg (ref 26.0–34.0)
MCHC: 34.5 g/dL (ref 30.0–36.0)
WBC: 6.9 10*3/uL (ref 4.0–10.5)

## 2010-06-23 LAB — PREGNANCY, URINE: Preg Test, Ur: NEGATIVE

## 2010-06-23 LAB — HEMOGLOBIN AND HEMATOCRIT, BLOOD
HCT: 34.3 % — ABNORMAL LOW (ref 36.0–46.0)
Hemoglobin: 11.8 g/dL — ABNORMAL LOW (ref 12.0–15.0)

## 2010-07-27 NOTE — Op Note (Signed)
NAME:  Leah Armstrong, Leah Armstrong           ACCOUNT NO.:  [id number]   MEDICAL RECORD NO.:  [id number]          PATIENT TYPE:  AMB   LOCATION:  DAY                          FACILITY:  [facility]   PHYSICIAN:  Almond Lint, MD       DATE OF BIRTH:  10-03-1965   DATE OF PROCEDURE:  06/19/2008  DATE OF DISCHARGE:                               OPERATIVE REPORT   PREOPERATIVE DIAGNOSIS:  Right buttock mass.   POSTOPERATIVE DIAGNOSIS:  Right buttock mass.   PROCEDURE PERFORMED:  Excision of right buttock mass.   SURGEON:  Almond Lint, MD.   ASSISTANT:  None.   ANESTHESIA:  General and local.   FINDINGS:  Fatty tumor approximately 2 cm in size in the right buttock.   SPECIMEN:  Mass to pathology.   ESTIMATED BLOOD LOSS:  Minimal.   COMPLICATIONS:  None known.   PROCEDURE:  [name] is a 45 year old female who was identified in  the holding area and taken to the operating room where she was placed  supine on the operating room table.  General anesthesia was induced.  She was then turned into a prone position.  Her upper buttocks were  prepped and draped in sterile fashion.  Time-outs were performed  according to surgical safety checklist.  When all was correct, we  continued.  The mass was identified.  Vertical incision was made over  the area for the mass which had been marked in the holding area.  This  was infiltrated with local anesthesia.  A number 15 blade was used to  incise the skin.  Thick skin flaps were created using the assistance of  skin hooks and the Bovie electrocautery.  Once flaps were created in all  directions, the mass was palpated and elevated out of the wound with an  Allis clamp.  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  This was taken out with the  Bovie.  The cavity was irrigated and inspected for hemostasis.  There  were 2 small areas of oozing which were coagulated with the Bovie.  The  space was closed by freeing up the Scarpa's layer and closing this with  interrupted  3-0 Vicryl.  The skin was then closed using interrupted 3-0  Vicryl in a deep dermal fashion followed by 4-0  Monocryl in a running subcuticular fashion.  Given the stress applied to  the buttocks, this was then closed with 2-0 nylon mattress sutures.  The  wound was then cleaned, dried and dressed with gauze and Tegaderm.  The  patient was turned back in the supine position, was awakened from  anesthesia.  Then brought to the PACU in stable condition.      Almond Lint, MD  Electronically Signed     FB/MEDQ  D:  06/19/2008  T:  06/19/2008  Job:  [id number]

## 2010-08-31 ENCOUNTER — Other Ambulatory Visit: Payer: Self-pay | Admitting: Obstetrics and Gynecology

## 2010-08-31 DIAGNOSIS — Z1231 Encounter for screening mammogram for malignant neoplasm of breast: Secondary | ICD-10-CM

## 2010-09-01 ENCOUNTER — Encounter: Payer: Self-pay | Admitting: Internal Medicine

## 2010-09-01 ENCOUNTER — Ambulatory Visit (INDEPENDENT_AMBULATORY_CARE_PROVIDER_SITE_OTHER): Payer: BC Managed Care – PPO | Admitting: Internal Medicine

## 2010-09-01 ENCOUNTER — Ambulatory Visit (INDEPENDENT_AMBULATORY_CARE_PROVIDER_SITE_OTHER)
Admission: RE | Admit: 2010-09-01 | Discharge: 2010-09-01 | Disposition: A | Discharge status: Home / Self Care | Payer: BC Managed Care – PPO | Source: Ambulatory Visit | Attending: Internal Medicine | Admitting: Internal Medicine

## 2010-09-01 VITALS — BP 98/70 | HR 84 | Temp 98.7°F | Ht 62.0 in | Wt 151.2 lb

## 2010-09-01 DIAGNOSIS — M25569 Pain in unspecified knee: Secondary | ICD-10-CM

## 2010-09-01 DIAGNOSIS — E785 Hyperlipidemia, unspecified: Secondary | ICD-10-CM | POA: Insufficient documentation

## 2010-09-01 DIAGNOSIS — Z0000 Encounter for general adult medical examination without abnormal findings: Secondary | ICD-10-CM

## 2010-09-01 DIAGNOSIS — M25561 Pain in right knee: Secondary | ICD-10-CM

## 2010-09-01 DIAGNOSIS — M549 Dorsalgia, unspecified: Secondary | ICD-10-CM

## 2010-09-01 MED ORDER — NAPROXEN 500 MG PO TBEC: 500.0000 mg | DELAYED_RELEASE_TABLET | Freq: Two times a day (BID) | ORAL | Status: DC

## 2010-09-01 MED ORDER — DICLOFENAC SODIUM 1.5 % TD SOLN: TRANSDERMAL | Status: DC

## 2010-09-01 NOTE — Patient Instructions (Addendum)
Take all new medications as prescribed Continue all other medications as before Please call if not improved in 1-2 wks for consideration for orthopedic referral Please go to XRAY in the Basement for the x-ray test Please call the phone number [phone] (the PhoneTree System) for results of testing in 2-3 days;  When calling, simply dial the number, and when prompted enter the MRN number above (the Medical Record Number) and the # key, then the message should start. Please return in August 2012 as planned with Lab testing done 3-5 days before

## 2010-09-01 NOTE — Progress Notes (Signed)
  Subjective:    Patient ID: Leah Armstrong, female    DOB: Apr 11, 1965, 45 y.o.   MRN: [id number]  HPI   Here to c/o 3 wks onset daily persistent medial right knee pain and puffiness, intermittent, worse to sit with legs dangling in a chair, better to stand and walk, not assoc with any injury she remembers , and without fever, wt loss,  worsening LE pain/numbness/weakness, gait change or falls. Has gained wt recently of about 10 lbs.  No LBP, left knee or leg pain.  Pt denies chest pain, increased sob or doe, wheezing, orthopnea, PND, increased LE swelling, palpitations, dizziness or syncope.  Pt denies new neurological symptoms such as new headache, or facial or extremity weakness or numbness  Pt denies polydipsia, polyuria Past Medical History  Diagnosis Date  . Acute sinusitis, unspecified 02/16/2010  . BACK PAIN 05/19/2010  . CERVICAL RADICULOPATHY 05/25/2009  . COLONIC POLYPS, HX OF 09/28/2007  . Diarrhea 09/28/2007  . HEADACHE, SINUS 01/17/2007  . Other disorders of Eustachian tube 03/09/2010  . OTITIS MEDIA, LEFT 03/09/2010  . Palpitations 01/17/2007  . SKIN LESION 09/28/2007  . Hyperlipidemia 09/01/2010   Past Surgical History  Procedure Date  . 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 She does not have any smokeless tobacco history on file. She reports that she does not drink alcohol. Her drug history not on file. family history includes Cancer in her mother and others; Coronary artery disease in her other; and Heart attack in her other. No Known Allergies No current outpatient prescriptions on file prior to visit.   Review of Systems Review of Systems  Constitutional: Negative for diaphoresis and unexpected weight change.  HENT: Negative for drooling and tinnitus.   Eyes: Negative for photophobia and visual disturbance.  Respiratory: Negative for choking and stridor.   Musculoskeletal: Negative for gait problem.  Skin: Negative for color change  and wound.  Neurological: Negative for tremors and numbness.  Psychiatric/Behavioral: Negative for decreased concentration. The patient is not hyperactive.       Objective:   Physical Exam BP 98/70  Pulse 84  Temp(Src) 98.7 F (37.1 C) (Oral)  Ht 5\' 2"  (1.575 m)  Wt 151 lb 4 oz (68.607 kg)  BMI 27.66 kg/m2  SpO2 98% Physical Exam  VS noted, mod obese Constitutional: Pt appears well-developed and well-nourished.  HENT: Head: Normocephalic.  Right Ear: External ear normal.  Left Ear: External ear normal.  Eyes: Conjunctivae and EOM are normal. Pupils are equal, round, and reactive to light.  Neck: Normal range of motion. Neck supple.  Cardiovascular: Normal rate and regular rhythm.   Pulmonary/Chest: Effort normal and breath sounds normal.  Neurological: Pt is alert. No cranial nerve deficit. motor/sens/dtr intact to LE's Skin: Skin is warm. No erythema.  Psychiatric: Pt behavior is normal. Thought content normal.  Right knee with mid tender and slight swelling over the medial joint line,o/w right knee FROM, NT, no crepitus       Assessment & Plan:

## 2010-09-01 NOTE — Assessment & Plan Note (Signed)
stable overall by hx and exam, most recent data reviewed with pt, and pt to continue medical treatment as before, to follow lower chol diet  Lab Results  Component Value Date   LDLCALC 122* 10/06/2009

## 2010-09-01 NOTE — Progress Notes (Signed)
Quick Note:  Voice message left on PhoneTree system - lab is negative, normal or otherwise stable, pt to continue same tx ______ 

## 2010-09-01 NOTE — Assessment & Plan Note (Signed)
stable overall by hx and exam, and pt to continue medical treatment as before, does not appear to be related to current symtpoms, pt reassured

## 2010-09-01 NOTE — Assessment & Plan Note (Addendum)
Medial only, exam c/w medial collateral ligamentous strain or bursitis most likely, mild, for nsaid and penssaid prn,  to f/u any worsening symptoms or concerns, consider ortho if persists 1-2 wks, check films today but doubt DJD related

## 2010-09-02 ENCOUNTER — Telehealth: Payer: Self-pay

## 2010-09-02 NOTE — Telephone Encounter (Signed)
Initiated prior authorization of [employer] 1.5%. Reference# [id number]. Form was faxed and given to MD to complete.

## 2010-09-17 ENCOUNTER — Ambulatory Visit: Payer: BC Managed Care – PPO

## 2010-09-27 ENCOUNTER — Ambulatory Visit
Admission: RE | Admit: 2010-09-27 | Discharge: 2010-09-27 | Disposition: A | Discharge status: Home / Self Care | Payer: BC Managed Care – PPO | Source: Ambulatory Visit | Attending: Obstetrics and Gynecology | Admitting: Obstetrics and Gynecology

## 2010-09-27 DIAGNOSIS — Z1231 Encounter for screening mammogram for malignant neoplasm of breast: Secondary | ICD-10-CM

## 2010-10-27 ENCOUNTER — Other Ambulatory Visit: Payer: BC Managed Care – PPO

## 2010-11-01 ENCOUNTER — Encounter: Payer: Self-pay | Admitting: Internal Medicine

## 2010-11-01 ENCOUNTER — Other Ambulatory Visit (INDEPENDENT_AMBULATORY_CARE_PROVIDER_SITE_OTHER): Payer: BC Managed Care – PPO

## 2010-11-01 DIAGNOSIS — Z0000 Encounter for general adult medical examination without abnormal findings: Secondary | ICD-10-CM

## 2010-11-01 DIAGNOSIS — E611 Iron deficiency: Secondary | ICD-10-CM

## 2010-11-01 DIAGNOSIS — D649 Anemia, unspecified: Secondary | ICD-10-CM | POA: Insufficient documentation

## 2010-11-01 HISTORY — DX: Iron deficiency: E61.1

## 2010-11-01 LAB — HEPATIC FUNCTION PANEL
AST: 16 U/L (ref 0–37)
Albumin: 3.8 g/dL (ref 3.5–5.2)
Alkaline Phosphatase: 47 U/L (ref 39–117)
Total Protein: 6.9 g/dL (ref 6.0–8.3)

## 2010-11-01 LAB — CBC WITH DIFFERENTIAL/PLATELET
Basophils Relative: 0.5 % (ref 0.0–3.0)
Eosinophils Relative: 1 % (ref 0.0–5.0)
Hemoglobin: 11.6 g/dL — ABNORMAL LOW (ref 12.0–15.0)
Lymphocytes Relative: 24 % (ref 12.0–46.0)
Monocytes Relative: 9.6 % (ref 3.0–12.0)
Neutro Abs: 4.4 10*3/uL (ref 1.4–7.7)
RBC: 3.7 Mil/uL — ABNORMAL LOW (ref 3.87–5.11)

## 2010-11-01 LAB — LIPID PANEL
Total CHOL/HDL Ratio: 3
Triglycerides: 99 mg/dL (ref 0.0–149.0)

## 2010-11-01 LAB — URINALYSIS, ROUTINE W REFLEX MICROSCOPIC
Nitrite: NEGATIVE
Urobilinogen, UA: 0.2 (ref 0.0–1.0)

## 2010-11-01 LAB — BASIC METABOLIC PANEL
CO2: 26 mEq/L (ref 19–32)
Calcium: 9 mg/dL (ref 8.4–10.5)
Creatinine, Ser: 1 mg/dL (ref 0.4–1.2)

## 2010-11-01 LAB — TSH: TSH: 1.47 u[IU]/mL (ref 0.35–5.50)

## 2010-11-01 LAB — B12 AND FOLATE PANEL: Vitamin B-12: 309 pg/mL (ref 211–911)

## 2010-11-01 LAB — IBC PANEL: Saturation Ratios: 9.2 % — ABNORMAL LOW (ref 20.0–50.0)

## 2010-11-03 ENCOUNTER — Ambulatory Visit (INDEPENDENT_AMBULATORY_CARE_PROVIDER_SITE_OTHER): Payer: BC Managed Care – PPO | Admitting: Internal Medicine

## 2010-11-03 ENCOUNTER — Encounter: Payer: Self-pay | Admitting: Internal Medicine

## 2010-11-03 VITALS — BP 102/70 | HR 78 | Temp 98.8°F | Ht 62.0 in | Wt 147.4 lb

## 2010-11-03 DIAGNOSIS — Z0000 Encounter for general adult medical examination without abnormal findings: Secondary | ICD-10-CM

## 2010-11-03 MED ORDER — NAPROXEN 500 MG PO TABS: 500.0000 mg | ORAL_TABLET | Freq: Two times a day (BID) | ORAL | Status: DC

## 2010-11-03 NOTE — Patient Instructions (Signed)
Continue all other medications as before Please return in 1 year for your yearly visit, or sooner if needed, with Lab testing done 3-5 days before  

## 2010-11-07 ENCOUNTER — Encounter: Payer: Self-pay | Admitting: Internal Medicine

## 2010-11-07 NOTE — Progress Notes (Signed)
Subjective:    Patient ID: Leah Armstrong, female    DOB: [birth year], 45 y.o.   MRN: [id number]  HPI Here for wellness and f/u;  Overall doing ok;  Pt denies CP, worsening SOB, DOE, wheezing, orthopnea, PND, worsening LE edema, palpitations, dizziness or syncope.  Pt denies neurological change such as new Headache, facial or extremity weakness.  Pt denies polydipsia, polyuria, or low sugar symptoms. Pt states overall good compliance with treatment and medications, good tolerability, and trying to follow lower cholesterol diet.  Pt denies worsening depressive symptoms, suicidal ideation or panic. No fever, wt loss, night sweats, loss of appetite, or other constitutional symptoms.  Pt states good ability with ADL's, low fall risk, home safety reviewed and adequate, no significant changes in hearing or vision, and occasionally active with exercise.  Currently on no meds.   Past Medical History  Diagnosis Date  . Acute sinusitis, unspecified 02/16/2010  . BACK PAIN 05/19/2010  . CERVICAL RADICULOPATHY 05/25/2009  . COLONIC POLYPS, HX OF 09/28/2007  . Diarrhea 09/28/2007  . HEADACHE, SINUS 01/17/2007  . Other disorders of Eustachian tube 03/09/2010  . OTITIS MEDIA, LEFT 03/09/2010  . Palpitations 01/17/2007  . SKIN LESION 09/28/2007  . Hyperlipidemia 09/01/2010  . Anemia 11/01/2010   Past Surgical History  Procedure Date  . 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 She does not have any smokeless tobacco history on file. She reports that she does not drink alcohol. Her drug history not on file. family history includes Cancer in her mother and others; Coronary artery disease in her other; and Heart attack in her other. No Known Allergies No current outpatient prescriptions on file prior to visit.   Review of Systems Review of Systems  Constitutional: Negative for diaphoresis, activity change, appetite change and unexpected weight change.  HENT: Negative for  hearing loss, ear pain, facial swelling, mouth sores and neck stiffness.   Eyes: Negative for pain, redness and visual disturbance.  Respiratory: Negative for shortness of breath and wheezing.   Cardiovascular: Negative for chest pain and palpitations.  Gastrointestinal: Negative for diarrhea, blood in stool, abdominal distention and rectal pain.  Genitourinary: Negative for hematuria, flank pain and decreased urine volume.  Musculoskeletal: Negative for myalgias and joint swelling.  Skin: Negative for color change and wound.  Neurological: Negative for syncope and numbness.  Hematological: Negative for adenopathy.  Psychiatric/Behavioral: Negative for hallucinations, self-injury, decreased concentration and agitation.      Objective:   Physical Exam BP 102/70  Pulse 78  Temp(Src) 98.8 F (37.1 C) (Oral)  Ht 5\' 2"  (1.575 m)  Wt 147 lb 6 oz (66.849 kg)  BMI 26.96 kg/m2  SpO2 97% Physical Exam  VS noted Constitutional: Pt is oriented to person, place, and time. Appears well-developed and well-nourished.  HENT:  Head: Normocephalic and atraumatic.  Right Ear: External ear normal.  Left Ear: External ear normal.  Nose: Nose normal.  Mouth/Throat: Oropharynx is clear and moist.  Eyes: Conjunctivae and EOM are normal. Pupils are equal, round, and reactive to light.  Neck: Normal range of motion. Neck supple. No JVD present. No tracheal deviation present.  Cardiovascular: Normal rate, regular rhythm, normal heart sounds and intact distal pulses.   Pulmonary/Chest: Effort normal and breath sounds normal.  Abdominal: Soft. Bowel sounds are normal. There is no tenderness.  Musculoskeletal: Normal range of motion. Exhibits no edema.  Lymphadenopathy:  Has no cervical adenopathy.  Neurological: Pt is alert and oriented  to person, place, and time. Pt has normal reflexes. No cranial nerve deficit.  Skin: Skin is warm and dry. No rash noted.  Psychiatric:  Has  normal mood and affect.  Behavior is normal.  1+ nervous       Assessment & Plan:

## 2010-11-07 NOTE — Assessment & Plan Note (Signed)

## 2010-12-24 ENCOUNTER — Encounter: Payer: Self-pay | Admitting: Internal Medicine

## 2010-12-24 ENCOUNTER — Ambulatory Visit (INDEPENDENT_AMBULATORY_CARE_PROVIDER_SITE_OTHER): Payer: BC Managed Care – PPO | Admitting: Internal Medicine

## 2010-12-24 VITALS — BP 92/62 | HR 81 | Temp 98.2°F | Ht 62.0 in | Wt 147.2 lb

## 2010-12-24 DIAGNOSIS — L02419 Cutaneous abscess of limb, unspecified: Secondary | ICD-10-CM

## 2010-12-24 DIAGNOSIS — L03116 Cellulitis of left lower limb: Secondary | ICD-10-CM

## 2010-12-24 MED ORDER — DOXYCYCLINE HYCLATE 100 MG PO TABS: 100.0000 mg | ORAL_TABLET | Freq: Two times a day (BID) | ORAL | Status: AC

## 2010-12-24 NOTE — Patient Instructions (Signed)
Take all new medications as prescribed Continue all other medications as before Please dont hesitate to go to the ER over the weekend if the pain or swelling becomes worse, or you develop high fever and chills

## 2010-12-25 ENCOUNTER — Encounter: Payer: Self-pay | Admitting: Internal Medicine

## 2010-12-25 NOTE — Assessment & Plan Note (Signed)
Mild to mod, for antibx course,  to f/u any worsening symptoms or concerns 

## 2010-12-25 NOTE — Progress Notes (Signed)
  Subjective:    Patient ID: Leah Armstrong, female    DOB: [birth year], 45 y.o.   MRN: [id number]  HPI  Here with acute onset 2 days left medial prox thigh area red, tender swelling without high fever, chills but quite painful about 6/10 , no d/c ;  No FH or personal hx of boils or mrsa, immune deficiency, risk for HIV, DM.  Pt denies chest pain, increased sob or doe, wheezing, orthopnea, PND, increased LE swelling, palpitations, dizziness or syncope.  Pt denies new neurological symptoms such as new headache, or facial or extremity weakness or numbness   Pt denies polydipsia, polyuria. Past Medical History  Diagnosis Date  . Acute sinusitis, unspecified 02/16/2010  . BACK PAIN 05/19/2010  . CERVICAL RADICULOPATHY 05/25/2009  . COLONIC POLYPS, HX OF 09/28/2007  . Diarrhea 09/28/2007  . HEADACHE, SINUS 01/17/2007  . Other disorders of Eustachian tube 03/09/2010  . OTITIS MEDIA, LEFT 03/09/2010  . Palpitations 01/17/2007  . SKIN LESION 09/28/2007  . Hyperlipidemia 09/01/2010  . Anemia 11/01/2010   Past Surgical History  Procedure Date  . 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 She does not have any smokeless tobacco history on file. She reports that she does not drink alcohol. Her drug history not on file. family history includes Cancer in her mother and others; Coronary artery disease in her other; and Heart attack in her other. No Known Allergies Current Outpatient Prescriptions on File Prior to Visit  Medication Sig Dispense Refill  . naproxen (NAPROSYN) 500 MG tablet Take 1 tablet (500 mg total) by mouth 2 (two) times daily with a meal.  60 tablet  2   Review of Systems Review of Systems  Constitutional: Negative for diaphoresis and unexpected weight change.  HENT: Negative for drooling and tinnitus.   Eyes: Negative for photophobia and visual disturbance.  Respiratory: Negative for choking and stridor.   Gastrointestinal: Negative for vomiting and  blood in stool.  Genitourinary: Negative for hematuria and decreased urine volume.      Objective:   Physical Exam BP 92/62  Pulse 81  Temp(Src) 98.2 F (36.8 C) (Oral)  Ht 5\' 2"  (1.575 m)  Wt 147 lb 4 oz (66.792 kg)  BMI 26.93 kg/m2  SpO2 97% Physical Exam  VS noted, not ill appearing but uncomfortable Constitutional: Pt appears well-developed and well-nourished.  HENT: Head: Normocephalic.  Right Ear: External ear normal.  Left Ear: External ear normal.  Eyes: Conjunctivae and EOM are normal. Pupils are equal, round, and reactive to light.  Neck: Normal range of motion. Neck supple.  Cardiovascular: Normal rate and regular rhythm.   Pulmonary/Chest: Effort normal and breath sounds normal.  Neurological: Pt is alert. No cranial nerve deficit.  Skin: Skin is warm. No erythema. except for 1-2 cm area nondiscrete area left prox medial left thigh erythema and tender without induration, fluctuance or drainage, with about 3-4 cm area swelling/tender as well, and 2 shotty mobile tender small < 1 cm LA noted left inguinal area Psychiatric: Pt behavior is normal. Thought content normal. 1+ nervous    Assessment & Plan:

## 2011-01-27 ENCOUNTER — Ambulatory Visit (INDEPENDENT_AMBULATORY_CARE_PROVIDER_SITE_OTHER): Payer: BC Managed Care – PPO | Admitting: Endocrinology

## 2011-01-27 ENCOUNTER — Encounter: Payer: Self-pay | Admitting: Endocrinology

## 2011-01-27 VITALS — BP 110/76 | HR 89 | Temp 97.6°F | Wt 151.0 lb

## 2011-01-27 DIAGNOSIS — J069 Acute upper respiratory infection, unspecified: Secondary | ICD-10-CM

## 2011-01-27 MED ORDER — PROMETHAZINE-CODEINE 6.25-10 MG/5ML PO SYRP: 5.0000 mL | ORAL_SOLUTION | ORAL | Status: AC | PRN

## 2011-01-27 MED ORDER — AZITHROMYCIN 500 MG PO TABS: 500.0000 mg | ORAL_TABLET | Freq: Every day | ORAL | Status: AC

## 2011-01-27 NOTE — Patient Instructions (Addendum)
Here are prescriptions for an antibiotic, and cough medication. Loratadine-d (non-prescription) will help your congestion. I hope you feel better soon.  If you don't feel better by next week, please call dr Jonny Ruiz.

## 2011-01-27 NOTE — Progress Notes (Signed)
  Subjective:    Patient ID: Leah Armstrong, female    DOB: [birth year], 45 y.o.   MRN: [id number]  HPI Pt states 3 days of moderate cough in the chest, and assoc hoarseness.  Past Medical History  Diagnosis Date  . Acute sinusitis, unspecified 02/16/2010  . BACK PAIN 05/19/2010  . CERVICAL RADICULOPATHY 05/25/2009  . COLONIC POLYPS, HX OF 09/28/2007  . Diarrhea 09/28/2007  . HEADACHE, SINUS 01/17/2007  . Other disorders of Eustachian tube 03/09/2010  . OTITIS MEDIA, LEFT 03/09/2010  . Palpitations 01/17/2007  . SKIN LESION 09/28/2007  . Hyperlipidemia 09/01/2010  . Anemia 11/01/2010    Past Surgical History  Procedure Date  . 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 Marital Status: Married    Spouse Name: N/A    Number of Children: N/A  . 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 Smoking status: Never Smoker   . Smokeless tobacco: Not on file  . Alcohol Use: No  . Drug Use: Not on file  . Sexually Active: Not on file   Other Topics Concern  . Not on file   Social History Narrative   Drinks one to two caffeinated beverages a day    No current outpatient prescriptions on file prior to visit.    No Known Allergies  Family History  Problem Relation Age of Onset  . Cancer Mother     GYN cancer  . Cancer Other     breast cancer  . Cancer Other     breast cancer  . Coronary artery disease Other   . Heart attack Other    BP 110/76  Pulse 89  Temp(Src) 97.6 F (36.4 C) (Oral)  Wt 151 lb (68.493 kg)  SpO2 99%  Review of Systems She has bilat otalgia and nasal congestion. No fever.      Objective:   Physical Exam VITAL SIGNS:  See vs page GENERAL: no distress head: no deformity eyes: no periorbital swelling, no proptosis external nose and ears are normal mouth: no lesion seen Left tm is red.  Right is normal NECK: There is no palpable thyroid  enlargement.  No thyroid nodule is palpable.  No palpable lymphadenopathy at the anterior neck. LUNGS:  Clear to auscultation     Assessment & Plan:  Glenford Peers, new

## 2011-02-02 ENCOUNTER — Ambulatory Visit (INDEPENDENT_AMBULATORY_CARE_PROVIDER_SITE_OTHER): Payer: BC Managed Care – PPO | Admitting: Internal Medicine

## 2011-02-02 ENCOUNTER — Encounter: Payer: Self-pay | Admitting: Internal Medicine

## 2011-02-02 VITALS — BP 106/60 | HR 89 | Temp 98.5°F | Ht 62.0 in | Wt 151.0 lb

## 2011-02-02 DIAGNOSIS — J029 Acute pharyngitis, unspecified: Secondary | ICD-10-CM

## 2011-02-02 MED ORDER — SULFAMETHOXAZOLE-TRIMETHOPRIM 800-160 MG PO TABS: 1.0000 | ORAL_TABLET | Freq: Two times a day (BID) | ORAL | Status: AC

## 2011-02-02 NOTE — Patient Instructions (Addendum)
Take all new medications as prescribed Continue all other medications as before  

## 2011-02-02 NOTE — Assessment & Plan Note (Signed)
Rather severe, ? Viral, not improved with recent zpack, did have recent unusual leg cellulitis that did improve with doxy (though had some diarrhea with that); ok for septra course,  to f/u any worsening symptoms or concerns

## 2011-02-02 NOTE — Progress Notes (Signed)
  Subjective:    Patient ID: Leah Armstrong, female    DOB: 1965-10-01, 45 y.o.   MRN: [id number]  HPI  Here with acute onset worsening severe ST for over 1 wk, with hoarseness, mild non prod cough, mild bilat ear discomfort , low grade temp, general weakness and malaise, and Pt denies chest pain, increased sob or doe, wheezing, orthopnea, PND, increased LE swelling, palpitations, dizziness or syncope. Pt denies new neurological symptoms such as new headache, or facial or extremity weakness or numbness   Pt denies polydipsia, polyuria.  Did finish the zpack from Dr Everardo All last visit, but no improved.  Did have recent unsuusal LLE cellultis that repsonded to doxy course wihtout complication, but did have some diarrhea with this, now resolved.     Past Medical History  Diagnosis Date  . Acute sinusitis, unspecified 02/16/2010  . BACK PAIN 05/19/2010  . CERVICAL RADICULOPATHY 05/25/2009  . COLONIC POLYPS, HX OF 09/28/2007  . Diarrhea 09/28/2007  . HEADACHE, SINUS 01/17/2007  . Other disorders of Eustachian tube 03/09/2010  . OTITIS MEDIA, LEFT 03/09/2010  . Palpitations 01/17/2007  . SKIN LESION 09/28/2007  . Hyperlipidemia 09/01/2010  . Anemia 11/01/2010   Past Surgical History  Procedure Date  . 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 She does not have any smokeless tobacco history on file. She reports that she does not drink alcohol. Her drug history not on file. family history includes Cancer in her mother and others; Coronary artery disease in her other; and Heart attack in her other. No Known Allergies Current Outpatient Prescriptions on File Prior to Visit  Medication Sig Dispense Refill  . promethazine-codeine (PHENERGAN WITH CODEINE) 6.25-10 MG/5ML syrup Take 5 mLs by mouth every 4 (four) hours as needed for cough.  240 mL  1  . azithromycin (ZITHROMAX) 500 MG tablet Take 1 tablet (500 mg total) by mouth daily.  5 tablet  0   Review of Systems All  otherwise neg per pt Objective:   Physical Exam BP 106/60  Pulse 89  Temp(Src) 98.5 F (36.9 C) (Oral)  Ht 5\' 2"  (1.575 m)  Wt 151 lb (68.493 kg)  BMI 27.62 kg/m2  SpO2 99% Physical Exam  VS noted, mild ill, warm to touch Constitutional: Pt appears well-developed and well-nourished.  HENT: Head: Normocephalic.  Right Ear: External ear normal.  Left Ear: External ear normal.  Bilat tm's mild erythema.  Sinus nontender.  Pharynx severe erythema, mild post pharynx diffuse swelling, without overt abscess or drainage Eyes: Conjunctivae and EOM are normal. Pupils are equal, round, and reactive to light.  Neck: Normal range of motion. Neck supple. bilat tender submandib LA noted Cardiovascular: Normal rate and regular rhythm.   Pulmonary/Chest: Effort normal and breath sounds normal.  Neurological: Pt is alert. No cranial nerve deficit.  Skin: Skin is warm. No erythema.  Psychiatric: Pt behavior is normal. Thought content normal. not overly nervous today    Assessment & Plan:

## 2011-02-12 ENCOUNTER — Encounter (HOSPITAL_COMMUNITY): Payer: Self-pay | Admitting: Emergency Medicine

## 2011-02-12 ENCOUNTER — Emergency Department (HOSPITAL_COMMUNITY)
Admission: EM | Admit: 2011-02-12 | Discharge: 2011-02-12 | Disposition: A | Discharge status: Home Health | Payer: BC Managed Care – PPO | Source: Home / Self Care | Attending: Emergency Medicine | Admitting: Emergency Medicine

## 2011-02-12 DIAGNOSIS — J329 Chronic sinusitis, unspecified: Secondary | ICD-10-CM

## 2011-02-12 MED ORDER — LEVOFLOXACIN 500 MG PO TABS: 500.0000 mg | ORAL_TABLET | Freq: Every day | ORAL | Status: AC

## 2011-02-12 MED ORDER — IBUPROFEN 600 MG PO TABS: 600.0000 mg | ORAL_TABLET | Freq: Four times a day (QID) | ORAL | Status: AC | PRN

## 2011-02-12 MED ORDER — FLUTICASONE PROPIONATE 50 MCG/ACT NA SUSP: 2.0000 | Freq: Every day | NASAL | Status: DC

## 2011-02-12 NOTE — ED Provider Notes (Addendum)
History     CSN: [id number] Arrival date & time: 02/12/2011  4:01 PM   First MD Initiated Contact with Patient 02/12/11 1506      Chief Complaint  Patient presents with  . Headache    HPI Comments: Pt with 3 weeks of frontal sinus pressure/pain L>R. Was on z pack without improvement. Currently on bactrim. Reports fevers tmax 102 starting several days ago. Also c/o L ear pain postnasal drip, nonproductive cough.  No N/V, St, wheeze, SOB  Patient is a 45 y.o. female presenting with sinusitis. The history is provided by the patient.  Sinusitis  This is a new problem. The current episode started more than 1 week ago. The maximum temperature recorded prior to her arrival was 102 to 102.9 F. The fever has been present for 1 to 2 days. The pain has been constant since onset. Associated symptoms include congestion, ear pain, sinus pressure and cough. Pertinent negatives include no chills, no sweats, no hoarse voice, no sore throat, no swollen glands and no shortness of breath. Treatments tried: z pack, bactrim, nyquil sinus.    Past Medical History  Diagnosis Date  . Acute sinusitis, unspecified 02/16/2010  . BACK PAIN 05/19/2010  . CERVICAL RADICULOPATHY 05/25/2009  . COLONIC POLYPS, HX OF 09/28/2007  . Diarrhea 09/28/2007  . HEADACHE, SINUS 01/17/2007  . Other disorders of Eustachian tube 03/09/2010  . OTITIS MEDIA, LEFT 03/09/2010  . Palpitations 01/17/2007  . SKIN LESION 09/28/2007  . Hyperlipidemia 09/01/2010  . Anemia 11/01/2010    Past Surgical History  Procedure Date  . 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 Abdominal hysterectomy     Family History  Problem Relation Age of Onset  . Cancer Mother     GYN cancer  . Cancer Other     breast cancer  . Cancer Other     breast cancer  . Coronary artery disease Other   . Heart attack Other     History  Substance Use Topics  . Smoking status: Never Smoker   . Smokeless tobacco: Not on file  . Alcohol Use: No    OB History      Grav Para Term Preterm Abortions TAB SAB Ect Mult Living                  Review of Systems  Constitutional: Negative for chills.  HENT: Positive for ear pain, congestion and sinus pressure. Negative for sore throat and hoarse voice.   Eyes: Negative for photophobia.  Respiratory: Positive for cough. Negative for shortness of breath.   Gastrointestinal: Negative for vomiting.  Musculoskeletal: Negative.   Skin: Negative for rash.  Neurological: Positive for headaches. Negative for weakness.  All other systems reviewed and are negative.    Allergies  Review of patient's allergies indicates no known allergies.  Home Medications   Current Outpatient Rx  Name Route Sig Dispense Refill  . SULFAMETHOXAZOLE-TRIMETHOPRIM 800-160 MG PO TABS Oral Take 1 tablet by mouth 2 (two) times daily. 20 tablet 0  . FLUTICASONE PROPIONATE 50 MCG/ACT NA SUSP Nasal Place 2 sprays into the nose daily. 16 g 0  . IBUPROFEN 600 MG PO TABS Oral Take 1 tablet (600 mg total) by mouth every 6 (six) hours as needed for pain. 30 tablet 0  . LEVOFLOXACIN 500 MG PO TABS Oral Take 1 tablet (500 mg total) by mouth daily. X 7 days 7 tablet 0    BP 118/77  Pulse 97  Temp(Src) 99.1 F (37.3 C) (  Oral)  Resp 18  SpO2 100%  Physical Exam  Nursing note and vitals reviewed. Constitutional: She is oriented to person, place, and time. She appears well-developed and well-nourished.  HENT:  Head: Normocephalic and atraumatic.  Right Ear: Hearing, tympanic membrane and ear canal normal.  Left Ear: Hearing and ear canal normal. Tympanic membrane is erythematous and retracted.  Nose: Mucosal edema and rhinorrhea present. No epistaxis.  Mouth/Throat: Uvula is midline and mucous membranes are normal. Posterior oropharyngeal erythema present. No oropharyngeal exudate.       (+) frontal, maxillary sinus tenderness L>R. Purulent nasal discharge L side.   Eyes: Conjunctivae and EOM are normal. Pupils are equal, round, and  reactive to light.  Neck: Normal range of motion. Neck supple.  Cardiovascular: Normal rate, regular rhythm and normal heart sounds.   Pulmonary/Chest: Effort normal and breath sounds normal. No respiratory distress. She has no wheezes. She has no rales.  Abdominal: She exhibits no distension. There is no tenderness. There is no rebound and no guarding.  Musculoskeletal: Normal range of motion.  Lymphadenopathy:    She has no cervical adenopathy.  Neurological: She is alert and oriented to person, place, and time.  Skin: Skin is warm and dry. No rash noted.  Psychiatric: She has a normal mood and affect. Her behavior is normal. Judgment and thought content normal.    ED Course  Procedures (including critical care time)  Labs Reviewed - No data to display No results found.   1. Sinusitis       MDM    Luiz Blare, MD 02/12/11 1709  Luiz Blare, MD 02/12/11 1740

## 2011-02-12 NOTE — ED Notes (Signed)
Headache, congestion, cough, dizziness, fever since 3 days ago.. Was on a z-pac 2 weeks ago for sinus and ear infection. When no improvement pt believes she may have taken Bactrim.

## 2011-02-14 ENCOUNTER — Ambulatory Visit (INDEPENDENT_AMBULATORY_CARE_PROVIDER_SITE_OTHER): Payer: BC Managed Care – PPO | Admitting: Internal Medicine

## 2011-02-14 ENCOUNTER — Encounter: Payer: Self-pay | Admitting: Internal Medicine

## 2011-02-14 VITALS — BP 100/62 | HR 78 | Temp 98.1°F | Ht 62.0 in | Wt 145.4 lb

## 2011-02-14 DIAGNOSIS — H9209 Otalgia, unspecified ear: Secondary | ICD-10-CM

## 2011-02-14 DIAGNOSIS — L270 Generalized skin eruption due to drugs and medicaments taken internally: Secondary | ICD-10-CM | POA: Insufficient documentation

## 2011-02-14 DIAGNOSIS — H9202 Otalgia, left ear: Secondary | ICD-10-CM | POA: Insufficient documentation

## 2011-02-14 MED ORDER — PREDNISONE 10 MG PO TABS: ORAL_TABLET | ORAL | Status: DC

## 2011-02-14 MED ORDER — AMOXICILLIN-POT CLAVULANATE 875-125 MG PO TABS: 1.0000 | ORAL_TABLET | Freq: Two times a day (BID) | ORAL | Status: AC

## 2011-02-14 MED ORDER — METHYLPREDNISOLONE ACETATE PF 80 MG/ML IJ SUSP
120.0000 mg | Freq: Once | INTRAMUSCULAR | Status: AC
  Administered 2011-02-14: 120 mg via INTRAMUSCULAR

## 2011-02-14 NOTE — Progress Notes (Signed)
  Subjective:    Patient ID: Leah Armstrong, female    DOB: 06-14-1965, 45 y.o.   MRN: [id number]  HPI  Here unfortunately with diffuse pruritic rash 2 days after start of levaquin after tx for worsening left ear and neck pain/fever at urgent care despite prior tx.  No tongue or other swelling, cough or sob/wheezing.  Pt denies chest pain, increased sob or doe, wheezing, orthopnea, PND, increased LE swelling, palpitations, dizziness or syncope.  Pt denies new neurological symptoms such as new headache, or facial or extremity weakness or numbness   Pt denies polydipsia, polyuria.  Overall good compliance with treatment, and good medicine tolerability. Past Medical History  Diagnosis Date  . Acute sinusitis, unspecified 02/16/2010  . BACK PAIN 05/19/2010  . CERVICAL RADICULOPATHY 05/25/2009  . COLONIC POLYPS, HX OF 09/28/2007  . Diarrhea 09/28/2007  . HEADACHE, SINUS 01/17/2007  . Other disorders of Eustachian tube 03/09/2010  . OTITIS MEDIA, LEFT 03/09/2010  . Palpitations 01/17/2007  . SKIN LESION 09/28/2007  . Hyperlipidemia 09/01/2010  . Anemia 11/01/2010   Past Surgical History  Procedure Date  . 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 Abdominal hysterectomy     reports that she has never smoked. She does not have any smokeless tobacco history on file. She reports that she does not drink alcohol. Her drug history not on file. family history includes Cancer in her mother and others; Coronary artery disease in her other; and Heart attack in her other. Allergies  Allergen Reactions  . Levaquin Rash   Current Outpatient Prescriptions on File Prior to Visit  Medication Sig Dispense Refill  . fluticasone (FLONASE) 50 MCG/ACT nasal spray Place 2 sprays into the nose daily.  16 g  0  . ibuprofen (ADVIL,MOTRIN) 600 MG tablet Take 1 tablet (600 mg total) by mouth every 6 (six) hours as needed for pain.  30 tablet  0  . levofloxacin (LEVAQUIN) 500 MG tablet Take 1 tablet (500 mg total) by mouth  daily. X 7 days  7 tablet  0   No current facility-administered medications on file prior to visit.   Review of Systems Review of Systems  Constitutional: Negative for diaphoresis and unexpected weight change.  HENT: Negative for drooling and tinnitus.   Eyes: Negative for photophobia and visual disturbance.  Respiratory: Negative for choking and stridor.   Gastrointestinal: Negative for vomiting and blood in stool.  Genitourinary: Negative for hematuria and decreased urine volume.     Objective:   Physical Exam BP 100/62  Pulse 78  Temp(Src) 98.1 F (36.7 C) (Oral)  Ht 5\' 2"  (1.575 m)  Wt 145 lb 6 oz (65.942 kg)  BMI 26.59 kg/m2  SpO2 98% Physical Exam  VS noted, mild ill Constitutional: Pt appears well-developed and well-nourished.  HENT: Head: Normocephalic.  Right Ear: External ear normal.  Left Ear: External ear normal.  Bilat tm's mild erythema.  Sinus nontender.  Pharynx mild erythema Eyes: Conjunctivae and EOM are normal. Pupils are equal, round, and reactive to light.  Neck: Normal range of motion. Neck supple.  Cardiovascular: Normal rate and regular rhythm.   Pulmonary/Chest: Effort normal and breath sounds normal.  Neurological: Pt is alert. No cranial nerve deficit.  Skin: Skin is warm. No erythema. but diffuse erythem rash Psychiatric: Pt behavior is normal. Thought content normal.     Assessment & Plan:

## 2011-02-14 NOTE — Assessment & Plan Note (Signed)
Improved, but now unable to take levaquin further, for augemntin course,  to f/u any worsening symptoms or concerns

## 2011-02-14 NOTE — Assessment & Plan Note (Signed)
To d/c levaquin and list as allergy, for benadryl otc prn, depomedrol IM now and predpack asd,  to f/u any worsening symptoms or concerns

## 2011-02-14 NOTE — Patient Instructions (Addendum)
You had the steroid shot today Stop the levaquin, and do not take in the future, as this is the likely cause of the rash Take all new medications as prescribed - the generic augmentin, and the prednisone Continue all other medications as before

## 2011-08-23 ENCOUNTER — Encounter: Payer: Self-pay | Admitting: Family Medicine

## 2011-08-23 ENCOUNTER — Ambulatory Visit (INDEPENDENT_AMBULATORY_CARE_PROVIDER_SITE_OTHER): Payer: BC Managed Care – PPO | Admitting: Family Medicine

## 2011-08-23 VITALS — BP 102/64 | HR 88 | Temp 98.6°F | Ht 62.0 in | Wt 142.0 lb

## 2011-08-23 DIAGNOSIS — D649 Anemia, unspecified: Secondary | ICD-10-CM

## 2011-08-23 LAB — CBC WITH DIFFERENTIAL/PLATELET
Basophils Absolute: 0 10*3/uL (ref 0.0–0.1)
Hemoglobin: 12.5 g/dL (ref 12.0–15.0)
Lymphocytes Relative: 21.3 % (ref 12.0–46.0)
Monocytes Relative: 9.4 % (ref 3.0–12.0)
Neutro Abs: 4.6 10*3/uL (ref 1.4–7.7)
RBC: 4.1 Mil/uL (ref 3.87–5.11)
RDW: 12.7 % (ref 11.5–14.6)
WBC: 6.7 10*3/uL (ref 4.5–10.5)

## 2011-08-23 NOTE — Assessment & Plan Note (Signed)
Prev labs reviewed. Anemia resolved on labs.  This could have been due to prev blood donation.  No further w/u needed.

## 2011-08-23 NOTE — Progress Notes (Signed)
New patient to [facility]. H/o anemia on prev labs but other labs were unremarkable at the time and she had donated to [facility] prev before the draw in question.  S/p hysterectomy, no blood in stool, s/p colonoscopy.  Feeling well o/w.  No FCNAVD.    Pelvic per gyn clinic.  Mammogram scheduled per patient at the breast center.    PMH and SH reviewed  ROS: See HPI, otherwise noncontributory.  Meds, vitals, and allergies reviewed.   GEN: nad, alert and oriented HEENT: mucous membranes moist NECK: supple w/o LA CV: rrr.  no murmur PULM: ctab, no inc wob ABD: soft, +bs EXT: no edema SKIN: no acute rash

## 2011-08-23 NOTE — Patient Instructions (Addendum)
I would get a flu shot each fall.   Go to the lab on the way out.  We'll contact you with your lab report. You could schedule a physical for next summer.

## 2011-08-24 ENCOUNTER — Encounter: Payer: Self-pay | Admitting: *Deleted

## 2011-09-22 ENCOUNTER — Other Ambulatory Visit: Payer: Self-pay | Admitting: Obstetrics and Gynecology

## 2011-09-22 DIAGNOSIS — Z1231 Encounter for screening mammogram for malignant neoplasm of breast: Secondary | ICD-10-CM

## 2011-10-11 ENCOUNTER — Ambulatory Visit
Admission: RE | Admit: 2011-10-11 | Discharge: 2011-10-11 | Disposition: A | Discharge status: Home / Self Care | Payer: BC Managed Care – PPO | Source: Ambulatory Visit | Attending: Obstetrics and Gynecology | Admitting: Obstetrics and Gynecology

## 2011-10-11 DIAGNOSIS — Z1231 Encounter for screening mammogram for malignant neoplasm of breast: Secondary | ICD-10-CM

## 2011-10-25 ENCOUNTER — Ambulatory Visit (INDEPENDENT_AMBULATORY_CARE_PROVIDER_SITE_OTHER): Payer: BC Managed Care – PPO | Admitting: Family Medicine

## 2011-10-25 ENCOUNTER — Encounter: Payer: Self-pay | Admitting: Family Medicine

## 2011-10-25 VITALS — BP 104/74 | HR 97 | Temp 98.7°F | Wt 132.8 lb

## 2011-10-25 DIAGNOSIS — M549 Dorsalgia, unspecified: Secondary | ICD-10-CM

## 2011-10-25 MED ORDER — CYCLOBENZAPRINE HCL 10 MG PO TABS: 5.0000 mg | ORAL_TABLET | Freq: Three times a day (TID) | ORAL | Status: AC | PRN

## 2011-10-25 NOTE — Assessment & Plan Note (Signed)
Likely benign muscle strain.  Flexeril, sedation caution.  Ibuprofen, GI caution.  Stretching and heating pad.  Fu prn.

## 2011-10-25 NOTE — Progress Notes (Signed)
Mid back pain near ribs and scapula.  Started about 4-5 days ago.  On the R side.  No midline pain.  Pain is not improved over last few days.  No trauma, falls, etc.  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  R handed.  No L sided sx.  No leg or arm symptoms.  No FCNAVD.  No dysuria.    Meds, vitals, and allergies reviewed.   ROS: See HPI.  Otherwise, noncontributory.  nad ncat Neck supple No midline pain through out the back R paraspinal muscles ttp on the R side of mid back but no CVA pain abd soft, not ttp No rash ctab rrr No bruising

## 2011-10-25 NOTE — Patient Instructions (Addendum)
Use a heating pad, ibuprofen with a meal, flexeril (sedation caution), and stretch.  This should improve.

## 2011-11-04 ENCOUNTER — Encounter: Payer: BC Managed Care – PPO | Admitting: Internal Medicine

## 2011-12-12 ENCOUNTER — Encounter: Payer: Self-pay | Admitting: Family Medicine

## 2011-12-12 ENCOUNTER — Ambulatory Visit (INDEPENDENT_AMBULATORY_CARE_PROVIDER_SITE_OTHER): Payer: BC Managed Care – PPO | Admitting: Family Medicine

## 2011-12-12 VITALS — BP 102/60 | HR 82 | Temp 98.5°F | Wt 130.0 lb

## 2011-12-12 DIAGNOSIS — R0982 Postnasal drip: Secondary | ICD-10-CM

## 2011-12-12 NOTE — Progress Notes (Signed)
ST and some post nasal gtt.  Noted a white spot on her throat.  No FNAVD.  No cough.  No rhinorrhea.  She had similar sx in fall in 2012.  No voice changes.  No myalgias.   She'll get a flu shot at work.    Meds, vitals, and allergies reviewed.   ROS: See HPI.  Otherwise, noncontributory.  GEN: nad, alert and oriented HEENT: mucous membranes moist, tm wnl, nasal exam wnl, mild cobblestoning and 1 tonsil stone noted but no exudates NECK: supple w/o LA CV: rrr.  no murmur PULM: ctab, no inc wob EXT: no edema SKIN: no acute rash

## 2011-12-12 NOTE — Patient Instructions (Addendum)
I would gargle with warm salt water and take generic claritin/loratadine 10mg  a day for the post nasal drip.  Take care.

## 2011-12-13 DIAGNOSIS — R0982 Postnasal drip: Secondary | ICD-10-CM | POA: Insufficient documentation

## 2011-12-13 NOTE — Assessment & Plan Note (Signed)
Likely either fall allergy or less likely mild, self limited viral process.  Supportive tx either way.  Would use claritin and f/u prn.  Reassured re: tonsil stone, would gargle with salt water prn.

## 2012-01-03 ENCOUNTER — Ambulatory Visit (INDEPENDENT_AMBULATORY_CARE_PROVIDER_SITE_OTHER)
Admission: RE | Admit: 2012-01-03 | Discharge: 2012-01-03 | Disposition: A | Discharge status: Home / Self Care | Payer: BC Managed Care – PPO | Source: Ambulatory Visit | Attending: Family Medicine | Admitting: Family Medicine

## 2012-01-03 ENCOUNTER — Encounter: Payer: Self-pay | Admitting: Family Medicine

## 2012-01-03 ENCOUNTER — Ambulatory Visit (INDEPENDENT_AMBULATORY_CARE_PROVIDER_SITE_OTHER): Payer: BC Managed Care – PPO | Admitting: Family Medicine

## 2012-01-03 VITALS — BP 98/68 | HR 84 | Temp 98.5°F | Wt 129.8 lb

## 2012-01-03 DIAGNOSIS — R109 Unspecified abdominal pain: Secondary | ICD-10-CM

## 2012-01-03 DIAGNOSIS — R1013 Epigastric pain: Secondary | ICD-10-CM

## 2012-01-03 LAB — CBC WITH DIFFERENTIAL/PLATELET
Basophils Absolute: 0 10*3/uL (ref 0.0–0.1)
Eosinophils Absolute: 0 10*3/uL (ref 0.0–0.7)
HCT: 36.7 % (ref 36.0–46.0)
Hemoglobin: 12.3 g/dL (ref 12.0–15.0)
Lymphs Abs: 2 10*3/uL (ref 0.7–4.0)
MCHC: 33.4 g/dL (ref 30.0–36.0)
Monocytes Relative: 7.2 % (ref 3.0–12.0)
Neutro Abs: 6.2 10*3/uL (ref 1.4–7.7)
Platelets: 360 10*3/uL (ref 150.0–400.0)
RDW: 13.3 % (ref 11.5–14.6)

## 2012-01-03 LAB — COMPREHENSIVE METABOLIC PANEL
ALT: 19 U/L (ref 0–35)
AST: 19 U/L (ref 0–37)
Alkaline Phosphatase: 44 U/L (ref 39–117)
Creatinine, Ser: 0.9 mg/dL (ref 0.4–1.2)
Sodium: 136 mEq/L (ref 135–145)
Total Bilirubin: 0.1 mg/dL — ABNORMAL LOW (ref 0.3–1.2)
Total Protein: 7.1 g/dL (ref 6.0–8.3)

## 2012-01-03 MED ORDER — IOHEXOL 300 MG/ML  SOLN
100.0000 mL | Freq: Once | INTRAMUSCULAR | Status: AC | PRN
  Administered 2012-01-03: 100 mL via INTRAVENOUS

## 2012-01-03 MED ORDER — GI COCKTAIL ~~LOC~~
30.0000 mL | Freq: Once | ORAL | Status: AC
  Administered 2012-01-03: 30 mL via ORAL

## 2012-01-03 NOTE — Patient Instructions (Addendum)
Go to the lab on the way out.  We'll contact you with your lab report. See [place] about your referral before you leave today.  

## 2012-01-03 NOTE — Progress Notes (Signed)
Pain near epigastrum.  Radiates through/to the back.  H/o likely IBS.  Pain is intermittent for a few days, worse today.  No vomiting but some nausea.  Had some hard stools recently.  No new foods or triggers known.  High fiber diet.  No fevers, chills, diarrhea, blood in stool.  Pain is better laying down, worse sitting up.  Hasn't nsaids other than taking goody's, BCs, 2-3x/week.  Hasn't tried antacids.    Nonsmoker, no drinking.  No dysuria.   S/p hysterectomy.    Meds, vitals, and allergies reviewed.   ROS: See HPI.  Otherwise, noncontributory.  Uncomfortable Mmm rrr ctab Back w/o CVA pain.  abd soft, not ttp in lower quadrants.  RUQ not ttp but is ttp near the epigastrum w/o rebound.  No masses.   Ext well perfused.   No improvement with GI cocktail.

## 2012-01-03 NOTE — Assessment & Plan Note (Addendum)
With concern for pancreatis.  She isn't toxic and doesn't need ER eval, but I would like to get imaging and labs today. She agrees.  Will await labs and call report.    See imaging- constipation.  See result note re: pulm nodule.  If nonsmoker lifetime, then no f/u needed per guidelines.   >25 min spent with face to face with patient, >50% counseling and/or coordinating care

## 2012-01-17 ENCOUNTER — Telehealth: Payer: Self-pay

## 2012-01-17 DIAGNOSIS — R911 Solitary pulmonary nodule: Secondary | ICD-10-CM

## 2012-01-17 NOTE — Telephone Encounter (Signed)
Done

## 2012-01-17 NOTE — Telephone Encounter (Signed)
Pt request referral for 2nd opinion for CT scan where spot was seen on lungs; requesting to see Dr Vicente Serene Odogwu.Please advise.

## 2012-01-18 ENCOUNTER — Telehealth: Payer: Self-pay | Admitting: Hematology and Oncology

## 2012-01-18 NOTE — Telephone Encounter (Signed)
S/W pt in re NP appt 11/26 @ 1 w/Dr. Dalene Carrow Referring Dr. Para March Dx- Pulmonary Nodule Welcome packet mailed.

## 2012-01-18 NOTE — Telephone Encounter (Signed)
C/D 01/18/12 for appt 02/07/12

## 2012-01-19 NOTE — Telephone Encounter (Signed)
Appt made and patient notified 

## 2012-02-07 ENCOUNTER — Encounter: Payer: Self-pay | Admitting: Hematology and Oncology

## 2012-02-07 ENCOUNTER — Other Ambulatory Visit: Payer: Self-pay | Admitting: Hematology and Oncology

## 2012-02-07 ENCOUNTER — Ambulatory Visit (HOSPITAL_BASED_OUTPATIENT_CLINIC_OR_DEPARTMENT_OTHER): Payer: BC Managed Care – PPO | Admitting: Hematology and Oncology

## 2012-02-07 ENCOUNTER — Telehealth: Payer: Self-pay | Admitting: Hematology and Oncology

## 2012-02-07 ENCOUNTER — Ambulatory Visit (HOSPITAL_BASED_OUTPATIENT_CLINIC_OR_DEPARTMENT_OTHER): Payer: BC Managed Care – PPO

## 2012-02-07 ENCOUNTER — Other Ambulatory Visit: Payer: BC Managed Care – PPO | Admitting: Lab

## 2012-02-07 VITALS — BP 107/75 | HR 82 | Temp 99.4°F | Resp 18 | Ht 62.5 in | Wt 130.0 lb

## 2012-02-07 DIAGNOSIS — R911 Solitary pulmonary nodule: Secondary | ICD-10-CM

## 2012-02-07 DIAGNOSIS — R1011 Right upper quadrant pain: Secondary | ICD-10-CM

## 2012-02-07 DIAGNOSIS — Z809 Family history of malignant neoplasm, unspecified: Secondary | ICD-10-CM

## 2012-02-07 NOTE — Telephone Encounter (Signed)
gv and printed pt appt schedule for Jan 2014 °

## 2012-02-07 NOTE — Progress Notes (Signed)
This office note has been dictated.

## 2012-02-07 NOTE — Progress Notes (Signed)
Checked in new patient. No financial issues. °

## 2012-02-07 NOTE — Patient Instructions (Addendum)
Leah Armstrong  [id number]   [facility] - AFTER VISIT SUMMARY   **RECOMMENDATIONS MADE BY THE CONSULTANT AND ANY TEST    RESULTS WILL BE SENT TO YOUR REFERRING DOCTORS.   YOUR EXAM FINDINGS, LABS AND RESULTS WERE DISCUSSED BY YOUR MD TODAY.  YOU CAN GO TO THE [place] WEB SITE FOR INSTRUCTIONS ON HOW TO ASSESS MY CHART FOR ADDITIONAL INFORMATION AS NEEDED.  Your Updated drug allergies are: Allergies as of 02/07/2012 - Review Complete 02/07/2012  Allergen Reaction Noted  . Levofloxacin Rash 02/14/2011    Your current list of medications are: No current outpatient prescriptions on file.     INSTRUCTIONS GIVEN AND DISCUSSED:  See attached schedule   SPECIAL INSTRUCTIONS/FOLLOW-UP:  See above.  I acknowledge that I have been informed and understand all the instructions given to me and received a copy.I know to contact the clinic, my physician, or go to the emergency Department if any problems should occur.   I do not have any more questions at this time, but understand that I may call the [facility] at [phone] during business hours should I have any further questions or need assistance in obtaining follow-up care.

## 2012-02-07 NOTE — Progress Notes (Signed)
Primary Care MD: Dr. Para March  OB/GYN: Dr. Ellyn Hack  OK to release information to: Windy Canny- spouse  Preferred phone contact: [phone] (CELL)  Work- [employer]

## 2012-02-08 ENCOUNTER — Ambulatory Visit (INDEPENDENT_AMBULATORY_CARE_PROVIDER_SITE_OTHER): Payer: BC Managed Care – PPO | Admitting: Family Medicine

## 2012-02-08 ENCOUNTER — Encounter: Payer: Self-pay | Admitting: Family Medicine

## 2012-02-08 VITALS — BP 100/70 | HR 72 | Temp 98.3°F | Wt 130.2 lb

## 2012-02-08 DIAGNOSIS — J029 Acute pharyngitis, unspecified: Secondary | ICD-10-CM

## 2012-02-08 DIAGNOSIS — J069 Acute upper respiratory infection, unspecified: Secondary | ICD-10-CM

## 2012-02-08 NOTE — Patient Instructions (Addendum)
Drink plenty of fluids, take tylenol as needed, and gargle with warm salt water for your throat.  This should gradually improve.  Take care.  Let us know if you have other concerns.  Use nasal saline. I would get a flu shot each fall (when you are feeling better).

## 2012-02-08 NOTE — Progress Notes (Signed)
CC:   Dwana Curd. Para March, M.D.  IDENTIFYING STATEMENT:  The patient is a 46 year old woman seen at the request of Dr. Para March with a pulmonary nodule.  HISTORY OF PRESENT ILLNESS:  The patient was seen and evaluated for abdominal discomfort which was intermittent.  She describes pain in the right upper quadrant radiating to the back.  It was felt that she may have had pancreatitis.  She denies nausea, vomiting, or hemoptysis.  She has had an intentional weight loss of 15 pounds over a 4-5 months period.  She has had no change in her bowel function.  Besides the pain in the right upper quadrant underneath her rib cage, she has no other symptoms, especially denying cough, shortness of breath, wheeze.  She had a CT scan of the abdomen and pelvis on 01/03/2012.  The CT scan was essentially unremarkable, except for constipation, and scout films of the lower chest had shown a 3-mm subpleural nodule in the right middle lobe.  PAST MEDICAL HISTORY: 1. Back pain. 2. Cervical radiculopathy diagnosed on 05/25/2009. 3. Palpitations. 4. History of anemia.  ALLERGIES:  Levofloxacin.  SOCIAL HISTORY:  The patient is married with 2 children.  She lives in [place].  She was an intermittent smoker, gave up 10 years ago.  She denies alcohol use.  She works with [employer].  FAMILY HISTORY:  Pertinent in that her mother had ovarian cancer.  Two maternal aunts had breast cancer.  Maternal uncle had lung cancer. Sister underwent hysterectomy for "pre-cancerous cells in the uterus or cervix."  HEALTH MAINTENANCE:  She is up-to-date with screening mammogram and pelvic exam which she reports were both unremarkable.  REVIEW OF SYSTEMS:  As above, specifically intermittent right upper quadrant abdominal discomfort radiating underneath the right rib cage to back.  Denies fever, chills, night sweats, anorexia.  Had an intentional weight loss, as above.  Respiratory:  Denies cough, hemoptysis,  wheeze, shortness of breath.  GU:  Denies dysuria, hematuria, nocturia, frequency.  Skin:  No bruising or bleeding.  Musculoskeletal:  Denies joint aches, muscle pains.  Neurologic:  Denies headache, vision change, extremity weakness.  Rest of review of systems (12-point review of systems) negative.  PHYSICAL EXAM:  General:  Patient is alert and oriented x3.  Vitals: Pulse 82, blood pressure 107/75, temperature 99.4, respirations 18, weight 130 pounds.  HEENT:  Head is atraumatic, normocephalic.  Sclerae anicteric.  Pupils equal, round, and reactive to light.  Mouth no thrush or lesions.  Neck:  Supple without adenopathy.  Chest:  Clear to both percussion and auscultation.  Cardiovascular:  First and second heart sounds present.  No added sounds or murmurs.  Abdomen:  Soft.  No masses.  No hepatomegaly.  Bowel sounds present.  Extremities:  No calf tenderness or edema.  Pulses present symmetrical.  CNS:  Nonfocal.  IMPRESSION AND PLAN:  Mrs. Mansour is a pleasant 46 year old woman with a subpleural nodule incidentally seen during imaging of the abdomen. She has a history of smoking.  She also has a family history significant for cancer.  Please see above for family history.  I feel it would be reasonable for her to complete a chest CT scan as a baseline.  I also recommend that she undergo genetic counseling based on her impressive family history.  We spent more than half the time discussing evaluation and management of pulmonary nodules.  We also reviewed her recent CTs. We will be telephoning her with results and will offer recommendations as needed.  Patient's questions were answered to her satisfaction.    ______________________________ Laurice Record, M.D. LIO/MEDQ  D:  02/07/2012  T:  02/08/2012  Job:  [id number]

## 2012-02-08 NOTE — Progress Notes (Signed)
Yesterday AM with L ear pain with swallowing.  Sore on tongue noted.  HA, facial pain.  Stuffy nose.  No R ear pain.  Some post nasal gtt.  No cough but some throat clearing. No fevers known.  Took some tylenol this AM.  Likely sick exposures.    Meds, vitals, and allergies reviewed.   ROS: See HPI.  Otherwise, noncontributory.  RST neg.    GEN: nad, alert and oriented HEENT: mucous membranes moist, tm w/o erythema, nasal exam w/o erythema, clear discharge noted,  OP with cobblestoning, L TM with SOM noted.  Prominent papilla noted on tongue but no other mass or ulceration.  NECK: supple w/o LA CV: rrr.   PULM: ctab, no inc wob EXT: no edema SKIN: no acute rash

## 2012-02-12 ENCOUNTER — Telehealth: Payer: Self-pay | Admitting: Family Medicine

## 2012-02-12 DIAGNOSIS — J069 Acute upper respiratory infection, unspecified: Secondary | ICD-10-CM | POA: Insufficient documentation

## 2012-02-12 NOTE — Assessment & Plan Note (Signed)
Likely viral. Should resolve.  See instructions.  Normal anatomy with prominent papilla on tongue.  She is aware of likely morbidity and mortality benefit of flu shots and was advised to get one when she is feeling well.

## 2012-02-12 NOTE — Telephone Encounter (Signed)
Call pt.  Her record needs to be clarified- I have her now listed as a former smoker in the EMR.  She has mult notes from prev docs that prev list her as never smoker, ie no tobacco history.  Her most recent note from Dr. Dalene Carrow lists her as a former smoker.  I had asked for her to give her smoking history (along with the question about the FH of lung cancer)- see the notes on labs from 01/03/12.  Had we had access to the smoking information she gave Dr. Dalene Carrow, then I would have already proceeded with the order for the f/u CT scan.  As it stands, I will defer to Dr. Dalene Carrow and I appreciate her help.    I was able to review Dr. Lonell Face note after I saw pt last week.

## 2012-02-13 NOTE — Telephone Encounter (Signed)
LMOVM to call in with clarification.

## 2012-02-14 NOTE — Telephone Encounter (Signed)
Patient says she has never smoked as a habit.  She says she has done it socially a few times but probably not more than 1 pack in her whole life.  She says she told that to Dr. Dalene Carrow and doesn't understand how that was misconstrued.

## 2012-02-15 NOTE — Telephone Encounter (Signed)
Noted, but if she ever smoked then I would proceed with the w/u per Dr. Dalene Carrow.  Thanks.

## 2012-03-15 ENCOUNTER — Encounter: Payer: BC Managed Care – PPO | Admitting: Genetic Counselor

## 2012-03-15 ENCOUNTER — Ambulatory Visit (HOSPITAL_COMMUNITY): Payer: BC Managed Care – PPO

## 2012-03-15 ENCOUNTER — Other Ambulatory Visit: Payer: BC Managed Care – PPO | Admitting: Lab

## 2012-04-22 ENCOUNTER — Emergency Department: Payer: Self-pay | Admitting: Emergency Medicine

## 2012-04-22 LAB — CBC
HGB: 13.8 g/dL (ref 12.0–16.0)
MCH: 30.7 pg (ref 26.0–34.0)
Platelet: 282 10*3/uL (ref 150–440)
RDW: 12.7 % (ref 11.5–14.5)
WBC: 8.5 10*3/uL (ref 3.6–11.0)

## 2012-04-22 LAB — COMPREHENSIVE METABOLIC PANEL
Albumin: 4.1 g/dL (ref 3.4–5.0)
Alkaline Phosphatase: 63 U/L (ref 50–136)
Anion Gap: 8 (ref 7–16)
BUN: 6 mg/dL — ABNORMAL LOW (ref 7–18)
Calcium, Total: 9 mg/dL (ref 8.5–10.1)
Co2: 24 mmol/L (ref 21–32)
Creatinine: 0.89 mg/dL (ref 0.60–1.30)
EGFR (African American): >60
EGFR (Non-African Amer.): >60
Osmolality: 274 (ref 275–301)
Sodium: 139 mmol/L (ref 136–145)
Total Protein: 7.8 g/dL (ref 6.4–8.2)

## 2012-04-22 LAB — URINALYSIS, COMPLETE
Bilirubin,UR: NEGATIVE
Nitrite: NEGATIVE
Ph: 7 (ref 4.5–8.0)
Squamous Epithelial: 3

## 2012-04-24 ENCOUNTER — Ambulatory Visit: Payer: BC Managed Care – PPO | Admitting: Family Medicine

## 2012-04-24 ENCOUNTER — Telehealth: Payer: Self-pay | Admitting: Family Medicine

## 2012-04-24 ENCOUNTER — Emergency Department: Payer: Self-pay | Admitting: Emergency Medicine

## 2012-04-24 NOTE — Telephone Encounter (Signed)
Patient had appointment with Dr.Duncan today for a follow up from the ER because she had passed out.  Patient's husband called and asked if patient could be seen right away because the patient was at work and felt lightheaded and had nausea.  I asked Dr.Duncan and he said patient should go to the ER instead of coming here.

## 2012-04-24 NOTE — Telephone Encounter (Signed)
I called pt, LMOVM.  Please try to call her tomorrow and get an update on her condition.  Thanks.

## 2012-04-28 ENCOUNTER — Encounter: Payer: Self-pay | Admitting: Family Medicine

## 2012-04-28 ENCOUNTER — Ambulatory Visit (INDEPENDENT_AMBULATORY_CARE_PROVIDER_SITE_OTHER): Payer: BC Managed Care – PPO | Admitting: Family Medicine

## 2012-04-28 VITALS — BP 110/70 | HR 112 | Temp 98.0°F | Wt 128.0 lb

## 2012-04-28 DIAGNOSIS — R55 Syncope and collapse: Secondary | ICD-10-CM

## 2012-04-28 DIAGNOSIS — S060X0A Concussion without loss of consciousness, initial encounter: Secondary | ICD-10-CM

## 2012-04-28 NOTE — Assessment & Plan Note (Signed)
Presumed with neg CT, will request records.  Put pt on concussion rest- d/w pt in detail.  She'll call back with update in a few days.  She agrees.

## 2012-04-28 NOTE — Progress Notes (Signed)
She was at home in the laundry room initally.  She felt "dizzy" and then woke up on the floor on 04/21/12.  Seen at [facility] on 04/22/12 with unremarkable labs and EKG. She was discharged home.  There was a stated dx of dehydration per her husband.    After the ER on 04/22/12, she slept most of the day.  She felt dizzy again later on 04/22/12 in the shower.  She didn't pass out, but she had to squat down on the floor until the episode passed.  She was improved except for a HA on 04/23/12 until the HA was worse in late PM. On the 11th, she didn't have a HA early AM.  Went to work but then started feeling dizzy again (she thought the room was spinning and she felt palpitations and SOB) and the headache came back.  She was seen again at [facility] on the 11th.    I don't have [facility] records from the 11th yet.  CT head was normal per patient report.  Repeat EKG was done, normal per report of patient. She was sent home with percocet for the HA and was dx'd with concussion.  When taking the percocet, she gets nauseated.  No photophobia and no photophobia now, but both present when she has a HA.  When she gets dizzy, closing her eyes helps.    The initial episode of syncope was not after sudden standing/position change.  She has a h/o of palpitations.  She has had a history of lower BP readings prev.    BP has been around 90s/60s at home with pulse ~100 +/- 10.  She doesn't have a headache now.    Meds, vitals, and allergies reviewed.   ROS: See HPI.  Otherwise, noncontributory.  GEN: nad, alert and oriented HEENT: mucous membranes moist, TM wnl, nasal and OP exam wnl, PERRL, EOMI NECK: supple w/o LA CV: rrr.  no murmur PULM: ctab, no inc wob ABD: soft, +bs EXT: no edema SKIN: no acute rash CN 2-12 wnl B, S/S/DTR wnl x4

## 2012-04-28 NOTE — Telephone Encounter (Signed)
Patient has been seen at Saturday clinic.

## 2012-04-28 NOTE — Patient Instructions (Addendum)
Concussion rest- limit exertion, activity, TV, etc as much as possible.  Drink plenty of fluids and add some salt to your food.   Call back with update on Monday and we'll set up the cardiology appointment.  Take care.  Out of work in meantime.

## 2012-04-28 NOTE — Assessment & Plan Note (Signed)
Unclear source.  H/o palpitations.  I would have her see cards.  In meantime, liberalize her salt intake and continue PO fluids.  She agrees.  nonfocal exam.  Out of work in meantime.  >25 min spent with face to face with patient, >50% counseling and/or coordinating care.

## 2012-04-30 ENCOUNTER — Telehealth: Payer: Self-pay | Admitting: *Deleted

## 2012-04-30 NOTE — Telephone Encounter (Signed)
Shanda Bumps (daughter) phoned back.  She has a concussion., not much improvement but not getting worse.  Daughter says you had already spoken with her Dad earlier.

## 2012-04-30 NOTE — Telephone Encounter (Signed)
Dr. Para March asked that I phone the patient to get an update from her previous 2 ER visits at [facility].  LMOVM to return call.

## 2012-04-30 NOTE — Telephone Encounter (Signed)
Noted.  I had talked with Shirlee Limerick about the cards referral.  Please check on patient via phone on Wednesday.

## 2012-05-03 NOTE — Telephone Encounter (Signed)
Patient says she is feeling better.  She is a little dizzy at times and her pulse is up some intermittently and her BP is low intermittently.  She has an appt with Cards tomorrow AM.

## 2012-05-03 NOTE — Telephone Encounter (Signed)
LMOVM asking for call back on update.

## 2012-05-04 ENCOUNTER — Encounter: Payer: Self-pay | Admitting: Cardiovascular Disease

## 2012-05-04 ENCOUNTER — Ambulatory Visit (INDEPENDENT_AMBULATORY_CARE_PROVIDER_SITE_OTHER): Payer: BC Managed Care – PPO | Admitting: Cardiovascular Disease

## 2012-05-04 VITALS — BP 94/62 | HR 76 | Ht 62.0 in | Wt 127.5 lb

## 2012-05-04 DIAGNOSIS — R55 Syncope and collapse: Secondary | ICD-10-CM

## 2012-05-04 DIAGNOSIS — I951 Orthostatic hypotension: Secondary | ICD-10-CM

## 2012-05-04 DIAGNOSIS — G90A Postural orthostatic tachycardia syndrome (POTS): Secondary | ICD-10-CM | POA: Insufficient documentation

## 2012-05-04 DIAGNOSIS — R000 Tachycardia, unspecified: Secondary | ICD-10-CM

## 2012-05-04 DIAGNOSIS — R0602 Shortness of breath: Secondary | ICD-10-CM

## 2012-05-04 NOTE — Assessment & Plan Note (Signed)
The exact etiology of her recent syncopal episode is not entirely clear. There was no evidence of arrhythmia and no other symptoms to suggest other etiologies. The episode was not witnessed but the description does not seem to fit a seizure.  Most likely the syncope was due to orthostatic hypotension and postural tachycardia. She has chronically low blood pressure. I will obtain an echocardiogram to evaluate for any underlying structural heart abnormalities. Also given her recent complaints of palpitations, I will request a 48-hour Holter monitor.

## 2012-05-04 NOTE — Progress Notes (Signed)
HPI  This is a 47 year old Caucasian female was referred by Dr. Para March for evaluation of syncope, palpitations and dyspnea. She had a syncopal episode on February 8. She was at home in the laundry room initally. She felt "dizzy" and then woke up on the floor . The episode was not witnessed and she doesn't know how long she was out. She had no chest pain or dyspnea at that time . Only preceding symptom was dizziness. There was no incontinence. She went to the emergency room at  [facility] on 04/22/12 with unremarkable labs and EKG. She was discharged home. There was a stated dx of dehydration. However, her renal function and electrolytes were completely normal.  After the ER on 04/22/12, she slept most of the day. She felt dizzy again later on 04/22/12 in the shower. She didn't pass out, but she had to squat down on the floor until the episode passed. She was improved except for a headache on 04/23/12 . She went to the emergency room again at [facility] A CT scan was done which showed no acute abnormalities. She reports having chronically low blood pressure. She denies any previous syncopal episodes. She does complain of feeling tired with increased dyspnea palpitations but no chest pain.     Allergies  Allergen Reactions  . Levofloxacin Rash     Current Outpatient Prescriptions on File Prior to Visit  Medication Sig Dispense Refill  . acetaminophen (TYLENOL) 500 MG tablet Take 500 mg by mouth every 6 (six) hours as needed.      . Aspirin-Acetaminophen-Caffeine (GOODY HEADACHE PO) Take by mouth as needed.       No current facility-administered medications on file prior to visit.     Past Medical History  Diagnosis Date  . BACK PAIN 05/19/2010  . CERVICAL RADICULOPATHY 05/25/2009  . COLONIC POLYPS, HX OF 09/28/2007  . Diarrhea 09/28/2007  . Palpitations 01/17/2007  . Anemia 11/01/2010     Past Surgical History  Procedure Laterality Date  . 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 Abdominal  hysterectomy      ovaries intact     Family History  Problem Relation Age of Onset  . Cancer Mother     ovarian cancer  . Cancer Other     breast cancer  . Breast cancer Other   . Cancer Other     breast cancer  . Coronary artery disease Other   . Heart attack Other   . Breast cancer Other   . Colon cancer Neg Hx   . Heart attack Maternal Grandmother      History   Social History  . Marital Status: Married    Spouse Name: N/A    Number of Children: N/A  . 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 Smoking status: Never Smoker   . Smokeless tobacco: Never Used  . Alcohol Use: No  . Drug Use: No  . Sexually Active: Not on file   Other Topics Concern  . 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  HENT: Negative for hearing loss, nosebleeds, congestion, sore throat, facial swelling, drooling, trouble swallowing, neck pain, voice change, sinus pressure and tinnitus.  Eyes: Negative for photophobia, pain,  discharge and visual disturbance.  Respiratory: Negative for apnea, cough, chest tightness and wheezing.  Cardiovascular: Negative for chest pain and leg swelling.  Gastrointestinal: Negative for nausea, vomiting, abdominal pain, diarrhea, constipation, blood in stool and abdominal distention.  Genitourinary: Negative for dysuria, urgency, frequency, hematuria and decreased urine volume.  Musculoskeletal: Negative for myalgias, back pain, joint swelling, arthralgias and gait problem.  Skin: Negative for color change, pallor, rash and wound.  Neurological: Negative for  tremors, seizures, speech difficulty, weakness, numbness and headaches.  Psychiatric/Behavioral: Negative for suicidal ideas, hallucinations,  behavioral problems and agitation. The patient is not nervous/anxious.     PHYSICAL EXAM   BP 94/62  Pulse 76  Ht 5\' 2"  (1.575 m)  Wt 127 lb 8 oz (57.834 kg)  BMI 23.31 kg/m2 Constitutional: She is oriented to person, place, and time. She appears well-developed and well-nourished. No distress.  HENT: No nasal discharge.  Head: Normocephalic and atraumatic.  Eyes: Pupils are equal and round. Right eye exhibits no discharge. Left eye exhibits no discharge.  Neck: Normal range of motion. Neck supple. No JVD present. No thyromegaly present.  Cardiovascular: Normal rate, regular rhythm, normal heart sounds. Exam reveals no gallop and no friction rub. No murmur heard.  Pulmonary/Chest: Effort normal and breath sounds normal. No stridor. No respiratory distress. She has no wheezes. She has no rales. She exhibits no tenderness.  Abdominal: Soft. Bowel sounds are normal. She exhibits no distension. There is no tenderness. There is no rebound and no guarding.  Musculoskeletal: Normal range of motion. She exhibits no edema and no tenderness.  Neurological: She is alert and oriented to person, place, and time. Coordination normal.  Skin: Skin is warm and dry. No rash noted. She is not diaphoretic. No erythema. No pallor.  Psychiatric: She has a normal mood and affect. Her behavior is normal. Judgment and thought content normal.     EKG: Sinus  Rhythm  Low voltage in precordial leads.   ABNORMAL    ASSESSMENT AND PLAN

## 2012-05-04 NOTE — Telephone Encounter (Signed)
Thanks, I'll await cards input.

## 2012-05-04 NOTE — Patient Instructions (Addendum)
Labs today.   Your physician has requested that you have an echocardiogram. Echocardiography is a painless test that uses sound waves to create images of your heart. It provides your doctor with information about the size and shape of your heart and how well your heart's chambers and valves are working. This procedure takes approximately one hour. There are no restrictions for this procedure.  Your physician has recommended that you wear a holter monitor. Holter monitors are medical devices that record the heart's electrical activity. Doctors most often use these monitors to diagnose arrhythmias. Arrhythmias are problems with the speed or rhythm of the heartbeat. The monitor is a small, portable device. You can wear one while you do your normal daily activities. This is usually used to diagnose what is causing palpitations/syncope (passing out).  Increase salt and fluid intake  Follow up after tests.

## 2012-05-04 NOTE — Assessment & Plan Note (Signed)
The patient's heart rate increased from 72 in the supine position to 101 in the standing position without any drop in blood pressure. I suspect that this has something to do with her recent syncopal episode. I advised her to increase her sodium and fluid intake. I will check TSH and morning cortisol level. Recent labs were overall unremarkable including normal renal function and electrolytes. If her symptoms persist in spite of increasing salt and fluid intake, I might consider treating her with fludrocortisone or Midodrin. Using a beta blocker is likely not an option given her baseline low blood pressure.

## 2012-05-05 LAB — TSH: TSH: 1.87 u[IU]/mL (ref 0.450–4.500)

## 2012-05-11 ENCOUNTER — Ambulatory Visit (INDEPENDENT_AMBULATORY_CARE_PROVIDER_SITE_OTHER): Payer: BC Managed Care – PPO

## 2012-05-11 DIAGNOSIS — R55 Syncope and collapse: Secondary | ICD-10-CM

## 2012-05-11 NOTE — Progress Notes (Signed)
Placed a 48 hrs holter monitor and went over instructions on how to use it and when to return it

## 2012-05-17 ENCOUNTER — Encounter: Payer: Self-pay | Admitting: Family Medicine

## 2012-05-17 ENCOUNTER — Ambulatory Visit (INDEPENDENT_AMBULATORY_CARE_PROVIDER_SITE_OTHER): Payer: BC Managed Care – PPO | Admitting: Family Medicine

## 2012-05-17 VITALS — BP 110/82 | HR 88 | Temp 97.6°F | Wt 132.0 lb

## 2012-05-17 DIAGNOSIS — M549 Dorsalgia, unspecified: Secondary | ICD-10-CM

## 2012-05-17 NOTE — Patient Instructions (Addendum)
If ice packs and aleve (with food) don't help, then [place] know.  This should gradually get better.  I'll await the cardiology report.

## 2012-05-17 NOTE — Progress Notes (Signed)
She has had some lightheaded spells while wearing the monitor per cards.  She is awaiting the results on that.    Back pain-  She had a bruise on her back, noted 2 days ago. The pain started about 1 week ago.  No new falls/trauma since the initial event from prev notes.  Hasn't passed out in the last week but felt like she has some heart racing.  Is sore laying down, turning over; sore to the touch.  In the midline, not laterally. In mid to lower T spine, not in L spine.  No CP.  Hasn't used tylenol, aleve. Local heat doesn't help.  No other bruising.   Meds, vitals, and allergies reviewed.   ROS: See HPI.  Otherwise, noncontributory.  nad ncat Neck with normal ROM  Back not ttp except for midline midT spine ttp No bruising noted on skin.   No paraspinal muscle tenderness No cva pain abd exam soft, not ttp Ext w/o edema

## 2012-05-18 NOTE — Assessment & Plan Note (Signed)
Unclear source. No bruising now.  No need to image today.  Would give this time to resolve, would use ice packs and aleve (with GI caution) and f/u prn.  She agrees.

## 2012-05-25 ENCOUNTER — Other Ambulatory Visit (INDEPENDENT_AMBULATORY_CARE_PROVIDER_SITE_OTHER): Payer: BC Managed Care – PPO

## 2012-05-25 ENCOUNTER — Other Ambulatory Visit: Payer: Self-pay

## 2012-05-25 ENCOUNTER — Telehealth: Payer: Self-pay

## 2012-05-25 DIAGNOSIS — R0602 Shortness of breath: Secondary | ICD-10-CM

## 2012-05-25 DIAGNOSIS — R000 Tachycardia, unspecified: Secondary | ICD-10-CM

## 2012-05-25 DIAGNOSIS — R55 Syncope and collapse: Secondary | ICD-10-CM

## 2012-05-25 NOTE — Telephone Encounter (Signed)
Pt was in today to have echo, would like to be called with holter results.

## 2012-05-28 NOTE — Telephone Encounter (Signed)
Do we know if these results are back? thanks

## 2012-05-30 NOTE — Telephone Encounter (Signed)
I will check on this. GL

## 2012-06-04 ENCOUNTER — Telehealth: Payer: Self-pay

## 2012-06-04 NOTE — Telephone Encounter (Signed)
Calling [employer]

## 2012-06-04 NOTE — Telephone Encounter (Signed)
appt made with Dr. Johney Frame Friday 3/28 at 0945 I will call pt to inform her of this appt

## 2012-06-04 NOTE — Telephone Encounter (Signed)
Pt informed Understanding verb 

## 2012-06-04 NOTE — Telephone Encounter (Signed)
Dr. Kirke Corin reviewed HM results "Patient needs urgent EP consult ASAP for recurrent SVT on holter" VO Dr. Adline Mango, RN

## 2012-06-08 ENCOUNTER — Ambulatory Visit (INDEPENDENT_AMBULATORY_CARE_PROVIDER_SITE_OTHER): Payer: BC Managed Care – PPO | Admitting: Internal Medicine

## 2012-06-08 ENCOUNTER — Encounter: Payer: Self-pay | Admitting: Internal Medicine

## 2012-06-08 VITALS — BP 101/70 | HR 86 | Ht 62.0 in | Wt 128.4 lb

## 2012-06-08 DIAGNOSIS — I498 Other specified cardiac arrhythmias: Secondary | ICD-10-CM

## 2012-06-08 DIAGNOSIS — I471 Supraventricular tachycardia: Secondary | ICD-10-CM

## 2012-06-08 MED ORDER — VERAPAMIL HCL ER 120 MG PO TBCR: EXTENDED_RELEASE_TABLET | ORAL | Status: DC

## 2012-06-08 NOTE — Patient Instructions (Addendum)
Your physician recommends that you schedule a follow-up appointment in: 6 weeks with Dr Johney Frame  Your physician has recommended you make the following change in your medication:  1) Start Verapamil 120mg  daily

## 2012-06-08 NOTE — Progress Notes (Signed)
Primary Care Physician: Crawford Givens, MD Referring Physician:  Dr Monico Blitz Leah Armstrong is a 47 y.o. female with a h/o syncope and recently diagnosed with SVT who presents today for EP consultation.  She reports initially having these episodes about 5 years ago.  She describes abrupt onset and offset of regular tachypalpitations.  These typically only last 5-10 second but may recur in runs frequently throughout the day.  Episodes are more likely to occur when active but are not particularly associated with exercise.  She does not have them when sleeping or supine.  She reports associated dizziness, presyncope, and rarely SOB.   She reports that in early February, she was in her usual state of health and putting washing powder in her washing machine.  She reports having abrupt presyncope (does not recall palpitations) but then lost consciousness and fell to the floor.  She reports waking on the floor and does not recall how long she had been unconscious.  She reports that upon waking, she continued to have dizziness, presyncope, and also noticed tachypalpitations at that time.  She did not seek immediate medical care but presented the next day.  She was eventually diagnosed with a concussion but did not have observed arrhythmia on the monitor.  She did not have documented arrhythmias at that point.  She was seen by Dr Kirke Corin and had an event monitor placed.  This has since documented a long RP narrow complex tachycardia.  She is now referred for EP consultation.  Today, she denies symptoms of chest pain, shortness of breath, orthopnea, PND, lower extremity edema,  or neurologic sequela. The patient is tolerating medications without difficulties and is otherwise without complaint today.   Past Medical History  Diagnosis Date  . BACK PAIN 05/19/2010  . CERVICAL RADICULOPATHY 05/25/2009  . COLONIC POLYPS, HX OF 09/28/2007  . Diarrhea 09/28/2007  . Iron deficiency 11/01/2010  . SVT (supraventricular  tachycardia)   . Syncope    Past Surgical History  Procedure Laterality Date  . 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 Abdominal hysterectomy      ovaries intact    Current Outpatient Prescriptions  Medication Sig Dispense Refill  . acetaminophen (TYLENOL) 500 MG tablet Take 500 mg by mouth every 6 (six) hours as needed.      . Aspirin-Acetaminophen-Caffeine (GOODY HEADACHE PO) Take by mouth as needed.       No current facility-administered medications for this visit.    Allergies  Allergen Reactions  . Levofloxacin Rash    History   Social History  . Marital Status: Married    Spouse Name: N/A    Number of Children: N/A  . 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 Smoking status: Never Smoker   . Smokeless tobacco: Never Used  . Alcohol Use: No  . Drug Use: No  . Sexually Active: Not on file   Other Topics Concern  . Not on file   Social History Narrative   Married 1991   Lives in [place] with spouse.     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 Cancer Mother     ovarian cancer  . Cancer Other     breast cancer  . Breast cancer Other   . Cancer Other  breast cancer  . Coronary artery disease Other   . Heart attack Other   . Breast cancer Other   . Colon cancer Neg Hx   . Heart attack Maternal Grandmother     ROS- All systems are reviewed and negative except as per the HPI above  Physical Exam: Filed Vitals:   06/08/12 0942  BP: 101/70  Pulse: 86  Height: 5\' 2"  (1.575 m)  Weight: 128 lb 6.4 oz (58.242 kg)    GEN- The patient is well appearing, alert and oriented x 3 today.   Head- normocephalic, atraumatic Eyes-  Sclera clear, conjunctiva pink Ears- hearing intact Oropharynx- clear Neck- supple, no JVP Lymph- no cervical lymphadenopathy Lungs- Clear to  ausculation bilaterally, normal work of breathing Heart- Regular rate and rhythm, no murmurs, rubs or gallops, PMI not laterally displaced GI- soft, NT, ND, + BS Extremities- no clubbing, cyanosis, or edema MS- no significant deformity or atrophy Skin- no rash or lesion Psych- euthymic mood, full affect Neuro- strength and sensation are intact  EKGs and event monitor are reviewed  Assessment and Plan:

## 2012-06-09 DIAGNOSIS — I471 Supraventricular tachycardia: Secondary | ICD-10-CM | POA: Insufficient documentation

## 2012-06-09 NOTE — Assessment & Plan Note (Signed)
The patient has symptomatic SVT documented.  This is long RP tachycardia which is irregular at times and likely represents atrial tachycardia, though I cannot completely exclude reentrant arrhythmias.  Given its very short duration, I think that ablation could be challenging.  I would therefore recommend that we try medical therapy first. Therapeutic strategies for supraventricular tachycardia including medicine and ablation were discussed in detail with the patient today. At this point, we will try verapamil.  IF she fails medical therapy with verapamil then beta blockers or flecainide would be options. Ablation would also be an option.

## 2012-06-12 ENCOUNTER — Other Ambulatory Visit: Payer: BC Managed Care – PPO

## 2012-06-14 ENCOUNTER — Ambulatory Visit: Payer: BC Managed Care – PPO | Admitting: Cardiovascular Disease

## 2012-07-10 ENCOUNTER — Telehealth: Payer: Self-pay | Admitting: Internal Medicine

## 2012-07-10 NOTE — Telephone Encounter (Signed)
Called patient back regarding her Verapamil.  lmom for her to call me back tomorrow

## 2012-07-10 NOTE — Telephone Encounter (Signed)
New Prob     Pt has some questions regarding medication she was prescribed during last visit. Would like to speak to nurse.

## 2012-07-11 NOTE — Telephone Encounter (Signed)
lmom for pt to return my call. 

## 2012-07-11 NOTE — Telephone Encounter (Signed)
Follow-up: ° ° ° °Patient called in returning your call.  Please call back. °

## 2012-07-12 MED ORDER — VERAPAMIL HCL ER 180 MG PO TBCR: EXTENDED_RELEASE_TABLET | ORAL | Status: DC

## 2012-07-12 NOTE — Telephone Encounter (Signed)
lmom for patient to return my call 

## 2012-07-12 NOTE — Telephone Encounter (Signed)
Follow up ° ° °Pt returning your call °

## 2012-07-12 NOTE — Telephone Encounter (Signed)
Spoke with patient, she is still having short episodes of feeling like her heart is pounding.  She is under a lot of stress at present due to her brother just having a MI this Tues.  I have discussed with Dr Johney Frame will increase her Verapamil to 180mg  daily and see if this helps.  She is scheduled to follow up on 07/23/12 and will keep this appointment

## 2012-07-23 ENCOUNTER — Ambulatory Visit (INDEPENDENT_AMBULATORY_CARE_PROVIDER_SITE_OTHER): Payer: BC Managed Care – PPO | Admitting: Internal Medicine

## 2012-07-23 ENCOUNTER — Encounter: Payer: Self-pay | Admitting: Internal Medicine

## 2012-07-23 VITALS — BP 118/60 | HR 80 | Ht 62.0 in | Wt 129.0 lb

## 2012-07-23 DIAGNOSIS — I498 Other specified cardiac arrhythmias: Secondary | ICD-10-CM

## 2012-07-23 DIAGNOSIS — I471 Supraventricular tachycardia: Secondary | ICD-10-CM

## 2012-07-23 MED ORDER — VERAPAMIL HCL ER 120 MG PO TBCR: EXTENDED_RELEASE_TABLET | ORAL | Status: DC

## 2012-07-23 NOTE — Assessment & Plan Note (Signed)
The patient has symptomatic SVT documented.  This is long RP tachycardia which is irregular at times and likely represents atrial tachycardia, though I cannot completely exclude reentrant arrhythmias.  Given its very short duration, I think that ablation could be challenging.  I would therefore recommend that we try medical therapy first. Increase verapamil to 120mg  BID at this time Return in 2 months

## 2012-07-23 NOTE — Patient Instructions (Addendum)
Change Verapamil to 120mg  1 tablet twice daily.  Follow up with Dr Johney Frame in 2 months.

## 2012-07-23 NOTE — Progress Notes (Signed)
Primary Care Physician: Crawford Givens, MD Referring Physician:  Dr Monico Blitz Leah Armstrong is a 47 y.o. female with a h/o syncope and SVT who presents today for EP follow up.   At her last visit, she was started on Verapamil 120mg  once daily.   This was then increased to 180mg  daily. Since being seen last, her brother has had an MI which has caused increased stress.  She feels her palpitations in the morning prior to taking her medications and then again in the evening before bed.    She feels as if the Verapamil is helping but that is is "wearing off" before her next dose is due.   Today, she denies symptoms of chest pain, shortness of breath, orthopnea, PND, lower extremity edema,  or neurologic sequela. The patient is tolerating medications without difficulties and is otherwise without complaint today.   Past Medical History  Diagnosis Date  . BACK PAIN 05/19/2010  . CERVICAL RADICULOPATHY 05/25/2009  . COLONIC POLYPS, HX OF 09/28/2007  . Diarrhea 09/28/2007  . Iron deficiency 11/01/2010  . SVT (supraventricular tachycardia)   . Syncope    Past Surgical History  Procedure Laterality Date  . 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 Abdominal hysterectomy      ovaries intact    Current Outpatient Prescriptions  Medication Sig Dispense Refill  . acetaminophen (TYLENOL) 500 MG tablet Take 500 mg by mouth every 6 (six) hours as needed.      . Aspirin-Acetaminophen-Caffeine (GOODY HEADACHE PO) Take by mouth as needed.      . verapamil (CALAN-SR) 180 MG CR tablet Take one by mouth Daily  90 tablet  3   No current facility-administered medications for this visit.    Allergies  Allergen Reactions  . Levofloxacin Rash    History   Social History  . Marital Status: Married    Spouse Name: N/A    Number of Children: N/A  . 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 Smoking status: Never Smoker   .  Smokeless tobacco: Never Used  . Alcohol Use: No  . Drug Use: No  . Sexually Active: Not on file   Other Topics Concern  . Not on file   Social History Narrative   Married 1991   Lives in [place] with spouse.     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 Cancer Mother     ovarian cancer  . Cancer Other     breast cancer  . Breast cancer Other   . Cancer Other     breast cancer  . Coronary artery disease Other   . Heart attack Other   . Breast cancer Other   . Colon cancer Neg Hx   . Heart attack Maternal Grandmother     ROS- All systems are reviewed and negative except as per the HPI above  Physical Exam: Filed Vitals:   07/23/12 1509  BP: 118/60  Pulse: 80  Height: 5\' 2"  (1.575 m)  Weight: 129 lb (58.514 kg)  SpO2: 97%    GEN- The patient is well appearing, alert and oriented x 3 today.   Head- normocephalic, atraumatic Eyes-  Sclera clear, conjunctiva pink Ears- hearing intact Oropharynx- clear Neck- supple, no JVP Lymph- no cervical lymphadenopathy Lungs- Clear to  ausculation bilaterally, normal work of breathing Heart- Regular rate and rhythm, no murmurs, rubs or gallops, PMI not laterally displaced GI- soft, NT, ND, + BS Extremities- no clubbing, cyanosis, or edema MS- no significant deformity or atrophy Skin- no rash or lesion Psych- euthymic mood, full affect Neuro- strength and sensation are intact  Assessment and Plan:

## 2012-08-27 ENCOUNTER — Other Ambulatory Visit: Payer: Self-pay

## 2012-08-27 DIAGNOSIS — Z1231 Encounter for screening mammogram for malignant neoplasm of breast: Secondary | ICD-10-CM

## 2012-09-24 ENCOUNTER — Ambulatory Visit: Payer: BC Managed Care – PPO | Admitting: Internal Medicine

## 2012-10-18 ENCOUNTER — Ambulatory Visit
Admission: RE | Admit: 2012-10-18 | Discharge: 2012-10-18 | Disposition: A | Discharge status: Home / Self Care | Payer: BC Managed Care – PPO | Source: Ambulatory Visit

## 2012-10-18 DIAGNOSIS — Z1231 Encounter for screening mammogram for malignant neoplasm of breast: Secondary | ICD-10-CM

## 2012-10-19 ENCOUNTER — Encounter: Payer: Self-pay | Admitting: Family Medicine

## 2012-10-19 ENCOUNTER — Ambulatory Visit (INDEPENDENT_AMBULATORY_CARE_PROVIDER_SITE_OTHER): Payer: BC Managed Care – PPO | Admitting: Family Medicine

## 2012-10-19 VITALS — BP 126/72 | HR 92 | Temp 98.2°F | Wt 127.2 lb

## 2012-10-19 DIAGNOSIS — J069 Acute upper respiratory infection, unspecified: Secondary | ICD-10-CM

## 2012-10-19 MED ORDER — FLUTICASONE PROPIONATE 50 MCG/ACT NA SUSP: 2.0000 | Freq: Every day | NASAL | Status: DC

## 2012-10-19 MED ORDER — LORATADINE 10 MG PO TABS: 10.0000 mg | ORAL_TABLET | Freq: Every day | ORAL | Status: DC

## 2012-10-19 NOTE — Patient Instructions (Signed)
Use flonase daily and take plain claritin 10mg  a day (generic is loratadine).  Take care.  Glad to see you.

## 2012-10-19 NOTE — Progress Notes (Signed)
5 days of sx.  Was on a cruise.  Others with similar in the meantime.  ST, cough, occ discolored sputum.  No tooth pain.  Ears are sore. HA likely from cough.  Using OTC nasal spray and nyquil.  No fevers.  Voice has been hoarse.   Meds, vitals, and allergies reviewed.   ROS: See HPI.  Otherwise, noncontributory.  GEN: nad, alert and oriented HEENT: mucous membranes moist, tm w/o erythema, nasal exam w/o erythema, clear discharge noted,  OP with cobblestoning, sinuses not ttp NECK: supple w/o LA CV: rrr.   PULM: ctab, no inc wob EXT: no edema SKIN: no acute rash

## 2012-10-19 NOTE — Assessment & Plan Note (Signed)
Viral vs allergic source likely.  Use flonase and claritin for now and this should improve.  Nontoxic.  Ddx d/w pt.

## 2012-10-22 ENCOUNTER — Encounter: Payer: Self-pay | Admitting: Internal Medicine

## 2012-10-22 ENCOUNTER — Ambulatory Visit (INDEPENDENT_AMBULATORY_CARE_PROVIDER_SITE_OTHER): Payer: BC Managed Care – PPO | Admitting: Internal Medicine

## 2012-10-22 VITALS — BP 110/60 | HR 82 | Ht 62.0 in | Wt 126.0 lb

## 2012-10-22 DIAGNOSIS — I498 Other specified cardiac arrhythmias: Secondary | ICD-10-CM

## 2012-10-22 DIAGNOSIS — I471 Supraventricular tachycardia: Secondary | ICD-10-CM

## 2012-10-22 NOTE — Patient Instructions (Addendum)
Your physician wants you to follow-up in: 12 months with Dr Allred You will receive a reminder letter in the mail two months in advance. If you don't receive a letter, please call our office to schedule the follow-up appointment.  

## 2012-10-22 NOTE — Progress Notes (Signed)
PCP:  Crawford Givens, MD Primary Cardiologist: Kirke Corin  The patient presents today for routine electrophysiology followup.  Since last being seen in our clinic, the patient reports doing very well.  Her palpitations are much improved with bid dosing Verapamil.  She didn't take her medicine about 3 weeks ago and developed heart racing with presyncope.  She does not think that she passed out fully.  Since restarting her medicines, she has had no further episodes.   Past Medical History  Diagnosis Date  . BACK PAIN 05/19/2010  . CERVICAL RADICULOPATHY 05/25/2009  . COLONIC POLYPS, HX OF 09/28/2007  . Diarrhea 09/28/2007  . Iron deficiency 11/01/2010  . SVT (supraventricular tachycardia)   . Syncope    Past Surgical History  Procedure Laterality Date  . 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 Abdominal hysterectomy      ovaries intact    Current Outpatient Prescriptions  Medication Sig Dispense Refill  . acetaminophen (TYLENOL) 500 MG tablet Take 500 mg by mouth every 6 (six) hours as needed.      . Aspirin-Acetaminophen-Caffeine (GOODY HEADACHE PO) Take by mouth as needed.      . fluticasone (FLONASE) 50 MCG/ACT nasal spray Place 2 sprays into the nose daily.  16 g  1  . loratadine (CLARITIN) 10 MG tablet Take 1 tablet (10 mg total) by mouth daily.      . NON FORMULARY XLEAR Nasal Spray      . sertraline (ZOLOFT) 50 MG tablet Take 1 tablet by mouth daily. Take for the next 30 days per Dr Para March      . verapamil (CALAN-SR) 120 MG CR tablet Take one by mouth twice daily  60 tablet  3   No current facility-administered medications for this visit.    Allergies  Allergen Reactions  . Levofloxacin Rash    History   Social History  . Marital Status: Married    Spouse Name: N/A    Number of Children: N/A  . 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 Smoking status: Never Smoker   . Smokeless tobacco: Never  Used  . Alcohol Use: No  . Drug Use: No  . Sexually Active: Not on file   Other Topics Concern  . Not on file   Social History Narrative   Married 1991   Lives in [place] with spouse.     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 Cancer Mother     ovarian cancer  . Cancer Other     breast cancer  . Breast cancer Other   . Cancer Other     breast cancer  . Coronary artery disease Other   . Heart attack Other   . Breast cancer Other   . Colon cancer Neg Hx   . Heart attack Maternal Grandmother     Physical Exam: Filed Vitals:   10/22/12 1000  BP: 110/60  Pulse: 82  Height: 5\' 2"  (1.575 m)  Weight: 126 lb (57.153 kg)    GEN- The patient is well appearing, alert and oriented x 3 today.   Head- normocephalic, atraumatic Eyes-  Sclera clear, conjunctiva pink Ears- hearing intact Oropharynx- clear Neck- supple, no JVP Lymph- no cervical lymphadenopathy Lungs- Clear to ausculation bilaterally, normal work of breathing Heart- Regular rate and  rhythm, no murmurs, rubs or gallops, PMI not laterally displaced GI- soft, NT, ND, + BS Extremities- no clubbing, cyanosis, or edema Neuro- strength and sensation are intact  EKG- sinus rhythm with normal intervals  Assessment and Plan:  1. SVT The patient has symptomatic SVT documented. This is long RP tachycardia which is irregular at times and likely represents atrial tachycardia, though I cannot completely exclude reentrant arrhythmias. Given its very short duration, I think that ablation could be challenging.  She prefers to continue medical therapy.  She reports good control when compliant with her medicine.  Compliance is encouraged today.  She is instructed to call me if recurrent symptoms occur.   Return in 12 months

## 2012-11-29 ENCOUNTER — Ambulatory Visit (INDEPENDENT_AMBULATORY_CARE_PROVIDER_SITE_OTHER): Payer: BC Managed Care – PPO | Admitting: Family Medicine

## 2012-11-29 ENCOUNTER — Encounter: Payer: Self-pay | Admitting: Family Medicine

## 2012-11-29 VITALS — BP 112/70 | HR 73 | Temp 97.7°F | Wt 131.2 lb

## 2012-11-29 DIAGNOSIS — M791 Myalgia, unspecified site: Secondary | ICD-10-CM | POA: Insufficient documentation

## 2012-11-29 DIAGNOSIS — R911 Solitary pulmonary nodule: Secondary | ICD-10-CM

## 2012-11-29 DIAGNOSIS — IMO0001 Reserved for inherently not codable concepts without codable children: Secondary | ICD-10-CM

## 2012-11-29 NOTE — Progress Notes (Signed)
She had talked to GYN about genetic testing.  This has been deferred for now.  She is considering it for next year.    Minimal smoker prev.  We talked about getting CT for pulm nodule f/u next month. She agreed.   She had diffuse aches in hands and arms and back and shoulder.  Some R hip pain. Last Saturday she was working on putting a backsplash in with her husband.  He is also similar with diffuse soreness.  She was grouting and tiling but no injury.  No FCNAVD, no cough.    She was started on sertraline per gyn.  Was more overwhelmed and irritable before the medicine.  The new med has helped.   Meds, vitals, and allergies reviewed.   ROS: See HPI.  Otherwise, noncontributory.  nad ncat Mmm rrr ctab finklestein pos B Shoulders and elbows sore with rom but not ttp at the joint lines.   No joint erythema in the hands.

## 2012-11-29 NOTE — Patient Instructions (Addendum)
Check your med list and make sure it is correct.   We'll be in touch about the repeat CT scan.

## 2012-11-29 NOTE — Assessment & Plan Note (Signed)
And tendonitis likely from recent strain.  Should resolve.  Benign exam.

## 2012-11-29 NOTE — Assessment & Plan Note (Signed)
Will set up f/u CT next month. She agrees.

## 2012-12-12 ENCOUNTER — Telehealth: Payer: Self-pay

## 2012-12-12 DIAGNOSIS — R911 Solitary pulmonary nodule: Secondary | ICD-10-CM

## 2012-12-12 NOTE — Telephone Encounter (Signed)
Pt request repeat CT to be done at [facility] 12/14/12,12/26/12 or 01/07/13. Pt was seen 11/29/12; please place order for CT.

## 2012-12-12 NOTE — Telephone Encounter (Signed)
Ordered. Thanks

## 2012-12-26 ENCOUNTER — Ambulatory Visit (INDEPENDENT_AMBULATORY_CARE_PROVIDER_SITE_OTHER)
Admission: RE | Admit: 2012-12-26 | Discharge: 2012-12-26 | Disposition: A | Discharge status: Home / Self Care | Payer: BC Managed Care – PPO | Source: Ambulatory Visit | Attending: Family Medicine | Admitting: Family Medicine

## 2012-12-26 DIAGNOSIS — R911 Solitary pulmonary nodule: Secondary | ICD-10-CM

## 2012-12-27 ENCOUNTER — Encounter: Payer: Self-pay | Admitting: Family Medicine

## 2012-12-27 NOTE — Telephone Encounter (Signed)
Phoned patient with results.

## 2013-01-17 ENCOUNTER — Other Ambulatory Visit: Payer: Self-pay

## 2013-03-05 ENCOUNTER — Encounter: Payer: Self-pay | Admitting: Internal Medicine

## 2013-03-05 ENCOUNTER — Other Ambulatory Visit: Payer: Self-pay | Admitting: Internal Medicine

## 2013-03-05 ENCOUNTER — Telehealth: Payer: Self-pay | Admitting: Internal Medicine

## 2013-03-05 MED ORDER — VERAPAMIL HCL ER 120 MG PO TBCR: EXTENDED_RELEASE_TABLET | ORAL | Status: DC

## 2013-03-05 NOTE — Telephone Encounter (Deleted)
error 

## 2013-03-05 NOTE — Telephone Encounter (Signed)
See e-mail notes

## 2013-03-26 ENCOUNTER — Encounter: Payer: Self-pay | Admitting: Family Medicine

## 2013-04-02 ENCOUNTER — Ambulatory Visit (INDEPENDENT_AMBULATORY_CARE_PROVIDER_SITE_OTHER)
Admission: RE | Admit: 2013-04-02 | Discharge: 2013-04-02 | Disposition: A | Discharge status: Home / Self Care | Payer: BC Managed Care – PPO | Source: Ambulatory Visit | Attending: Family Medicine | Admitting: Family Medicine

## 2013-04-02 ENCOUNTER — Ambulatory Visit (INDEPENDENT_AMBULATORY_CARE_PROVIDER_SITE_OTHER): Payer: BC Managed Care – PPO | Admitting: Family Medicine

## 2013-04-02 ENCOUNTER — Encounter: Payer: Self-pay | Admitting: Family Medicine

## 2013-04-02 VITALS — BP 106/64 | HR 80 | Temp 98.3°F | Wt 146.8 lb

## 2013-04-02 DIAGNOSIS — R194 Change in bowel habit: Secondary | ICD-10-CM

## 2013-04-02 DIAGNOSIS — R198 Other specified symptoms and signs involving the digestive system and abdomen: Secondary | ICD-10-CM

## 2013-04-02 DIAGNOSIS — R635 Abnormal weight gain: Secondary | ICD-10-CM

## 2013-04-02 NOTE — Progress Notes (Signed)
Pre-visit discussion using our clinic review tool. No additional management support is needed unless otherwise documented below in the visit note.  GI sx started about 2-3 weeks ago.  Started with inc in gas, then had stool unexpectedly with flatus.  This is new for her.  She has a h/o IBS with diarrhea.  No fevers.  No vomiting. No nausea.  No blood in stool.  She is taking dulcolax to help with constipation and hard stools. She doesn't always have the urge to defecate or pass gas.  She does have BMs occ with urge and successful voiding.  She has increased fiber.  Occ lower back pain, intermittent.  She can sometimes have back pain before a BM.   S/p hysterectomy and bladder tack.     Weight gain is noted in the last few months. No med changes.    Meds, vitals, and allergies reviewed.   ROS: See HPI.  Otherwise, noncontributory.  GEN: nad, alert and oriented HEENT: mucous membranes moist NECK: supple w/o LA, thyroid not ttp CV: rrr.  PULM: ctab, no inc wob ABD: soft, +bs EXT: no edema SKIN: no acute rash

## 2013-04-02 NOTE — Patient Instructions (Signed)
Go to the lab on the way out.  We'll contact you with your lab and xray report. We'll go from there.  Take care.     

## 2013-04-03 DIAGNOSIS — R194 Change in bowel habit: Secondary | ICD-10-CM | POA: Insufficient documentation

## 2013-04-03 LAB — TSH: TSH: 1.5 u[IU]/mL (ref 0.35–5.50)

## 2013-04-03 NOTE — Assessment & Plan Note (Addendum)
Stool is scattered throughout the colon on KUB.  Awaiting TSH.   If TSH abnormal, then will address.  O/w, would offer miralax trial vs GI eval.   See notes on labs.  >25 min spent with face to face with patient, >50% counseling and/or coordinating care.

## 2013-12-11 ENCOUNTER — Telehealth: Payer: Self-pay | Admitting: Family Medicine

## 2013-12-11 NOTE — Telephone Encounter (Signed)
Okay with me if okay with Dayton MartesAron.  Thanks.

## 2013-12-11 NOTE — Telephone Encounter (Signed)
Ok with me 

## 2013-12-11 NOTE — Telephone Encounter (Signed)
Patient is asking to switch from Dr.Duncan to Dr.Aron.  Patient said she would prefer to have a female doctor.  She said she's getting older and her body is changing and she'd feel more comfortable talking to a female.

## 2013-12-12 ENCOUNTER — Telehealth: Payer: Self-pay | Admitting: Internal Medicine

## 2013-12-12 HISTORY — PX: OTHER SURGICAL HISTORY: SHX169

## 2013-12-12 MED ORDER — VERAPAMIL HCL ER 120 MG PO TBCR: EXTENDED_RELEASE_TABLET | ORAL | Status: DC

## 2013-12-12 NOTE — Telephone Encounter (Signed)
Spoke with patient and for the last 2-3 weeks she has been having daily episodes of heart racing and has been taking her Verapamil 120mg  daily.  She feels tired and at times feels dizzy and like she was going to pass out. Took another Verapamil and she feels fine dow.  I let her know at his last office visit she was to take the Verapamil to 120mg  bid and follow up in 12 months.  She has not been taking it twice daily.  She will start and I will call in the Rx for her.  She is going to call and get her follow up with Dr Johney FrameAllred

## 2013-12-12 NOTE — Telephone Encounter (Signed)
New message           C/o symptoms with heart is getting worse / Does medication need to be adjusted?

## 2013-12-27 ENCOUNTER — Other Ambulatory Visit: Payer: BC Managed Care – PPO

## 2014-01-01 ENCOUNTER — Encounter: Payer: Self-pay | Admitting: Internal Medicine

## 2014-01-01 ENCOUNTER — Encounter: Payer: Self-pay | Admitting: *Deleted

## 2014-01-01 ENCOUNTER — Ambulatory Visit (INDEPENDENT_AMBULATORY_CARE_PROVIDER_SITE_OTHER): Payer: BC Managed Care – PPO | Admitting: Internal Medicine

## 2014-01-01 ENCOUNTER — Telehealth: Payer: Self-pay | Admitting: Cardiology

## 2014-01-01 VITALS — BP 102/84 | HR 171 | Ht 62.0 in | Wt 149.1 lb

## 2014-01-01 DIAGNOSIS — I471 Supraventricular tachycardia: Secondary | ICD-10-CM

## 2014-01-01 LAB — CBC WITH DIFFERENTIAL/PLATELET
Basophils Absolute: 0 10*3/uL (ref 0.0–0.1)
Basophils Relative: 0 % (ref 0–1)
Eosinophils Absolute: 0.1 10*3/uL (ref 0.0–0.7)
Eosinophils Relative: 1 % (ref 0–5)
HCT: 39.6 % (ref 36.0–46.0)
Hemoglobin: 13.8 g/dL (ref 12.0–15.0)
LYMPHS ABS: 2.7 10*3/uL (ref 0.7–4.0)
LYMPHS PCT: 25 % (ref 12–46)
MCH: 31.2 pg (ref 26.0–34.0)
MCHC: 34.8 g/dL (ref 30.0–36.0)
MCV: 89.4 fL (ref 78.0–100.0)
MONOS PCT: 9 % (ref 3–12)
Monocytes Absolute: 1 10*3/uL (ref 0.1–1.0)
NEUTROS ABS: 7.1 10*3/uL (ref 1.7–7.7)
NEUTROS PCT: 65 % (ref 43–77)
PLATELETS: 377 10*3/uL (ref 150–400)
RBC: 4.43 MIL/uL (ref 3.87–5.11)
RDW: 13 % (ref 11.5–15.5)
WBC: 10.9 10*3/uL — AB (ref 4.0–10.5)

## 2014-01-01 LAB — BASIC METABOLIC PANEL
BUN: 11 mg/dL (ref 6–23)
CO2: 27 mEq/L (ref 19–32)
CREATININE: 0.81 mg/dL (ref 0.50–1.10)
Calcium: 9.3 mg/dL (ref 8.4–10.5)
Chloride: 105 mEq/L (ref 96–112)
Glucose, Bld: 93 mg/dL (ref 70–99)
POTASSIUM: 3.9 meq/L (ref 3.5–5.3)
Sodium: 139 mEq/L (ref 135–145)

## 2014-01-01 NOTE — Telephone Encounter (Signed)
Labs were normal except WBC mildly elevated at 10.9,

## 2014-01-01 NOTE — Patient Instructions (Signed)
Your physician has recommended that you have an ablation. Catheter ablation is a medical procedure used to treat some cardiac arrhythmias (irregular heartbeats). During catheter ablation, a long, thin, flexible tube is put into a blood vessel in your groin (upper thigh), or neck. This tube is called an ablation catheter. It is then guided to your heart through the blood vessel. Radio frequency waves destroy small areas of heart tissue where abnormal heartbeats may cause an arrhythmia to start. Please see the instruction sheet given to you today.  See instruction sheet  Cardiac Ablation Cardiac ablation is a procedure to disable a small amount of heart tissue in very specific places. The heart has many electrical connections. Sometimes these connections are abnormal and can cause the heart to beat very fast or irregularly. By disabling some of the problem areas, heart rhythm can be improved or made normal. Ablation is done for people who:   Have Wolff-Parkinson-White syndrome.   Have other fast heart rhythms (tachycardia).   Have taken medicines for an abnormal heart rhythm (arrhythmia) that resulted in:   No success.   Side effects.   May have a high-risk heartbeat that could result in death.  LET [facility]ARE PROVIDER KNOW ABOUT:   Any allergies you have or any previous reactions you have had to X-ray dye, food (such as seafood), medicine, or tape.   All medicines you are taking, including vitamins, herbs, eye drops, creams, and over-the-counter medicines.   Previous problems you or members of your family have had with the use of anesthetics.   Any blood disorders you have.   Previous surgeries or procedures (such as a kidney transplant) you have had.   Medical conditions you have (such as kidney failure).  RISKS AND COMPLICATIONS Generally, cardiac ablation is a safe procedure. However, problems can occur and include:   Increased risk of cancer. Depending on how  long it takes to do the ablation, the dose of radiation can be high.  Bruising and bleeding where a thin, flexible tube (catheter) was inserted during the procedure.   Bleeding into the chest, especially into the sac that surrounds the heart (serious).  Need for a permanent pacemaker if the normal electrical system is damaged.   The procedure may not be fully effective, and this may not be recognized for months. Repeat ablation procedures are sometimes required. BEFORE THE PROCEDURE   Follow any instructions from your health care provider regarding eating and drinking before the procedure.   Take your medicines as directed at regular times with water, unless instructed otherwise by your health care provider. If you are taking diabetes medicine, including insulin, ask how you are to take it and if there are any special instructions you should follow. It is common to adjust insulin dosing the day of the ablation.  PROCEDURE  An ablation is usually performed in a catheterization laboratory with the guidance of fluoroscopy. Fluoroscopy is a type of X-ray that helps your health care provider see images of your heart during the procedure.   An ablation is a minimally invasive procedure. This means a small cut (incision) is made in either your neck or groin. Your health care provider will decide where to make the incision based on your medical history and physical exam.  An IV tube will be started before the procedure begins. You will be given an anesthetic or medicine to help you relax (sedative).  The skin on your neck or groin will be numbed. A needle will be  inserted into a large vein in your neck or groin and catheters will be threaded to your heart.  A special dye that shows up on fluoroscopy pictures may be injected through the catheter. The dye helps your health care provider see the area of the heart that needs treatment.  The catheter has electrodes on the tip. When the area of  heart tissue that is causing the arrhythmia is found, the catheter tip will send an electrical current to the area and "scar" the tissue. Three types of energy can be used to ablate the heart tissue:   Heat (radiofrequency energy).   Laser energy.   Extreme cold (cryoablation).   When the area of the heart has been ablated, the catheter will be taken out. Pressure will be held on the insertion site. This will help the insertion site clot and keep it from bleeding. A bandage will be placed on the insertion site.  AFTER THE PROCEDURE   After the procedure, you will be taken to a recovery area where your vital signs (blood pressure, heart rate, and breathing) will be monitored. The insertion site will also be monitored for bleeding.   You will need to lie still for 4-6 hours. This is to ensure you do not bleed from the catheter insertion site.  Document Released: 07/17/2008 Document Revised: 07/15/2013 Document Reviewed: 07/23/2012 [facility] Patient Information 2015 [place], [place]. This information is not intended to replace advice given to you by your health care provider. Make sure you discuss any questions you have with your health care provider.

## 2014-01-02 ENCOUNTER — Encounter (HOSPITAL_COMMUNITY): Payer: Self-pay | Admitting: Emergency Medicine

## 2014-01-02 ENCOUNTER — Encounter (HOSPITAL_COMMUNITY): Payer: Self-pay | Admitting: Anesthesiology

## 2014-01-02 ENCOUNTER — Encounter: Payer: Self-pay | Admitting: Internal Medicine

## 2014-01-02 ENCOUNTER — Encounter (HOSPITAL_COMMUNITY)
Admission: RE | Disposition: A | Discharge status: Home / Self Care | Payer: Self-pay | Source: Ambulatory Visit | Attending: Internal Medicine

## 2014-01-02 ENCOUNTER — Encounter (HOSPITAL_COMMUNITY): Payer: BC Managed Care – PPO | Admitting: Anesthesiology

## 2014-01-02 ENCOUNTER — Ambulatory Visit (HOSPITAL_COMMUNITY)
Admission: RE | Admit: 2014-01-02 | Discharge: 2014-01-03 | Disposition: A | Discharge status: Home / Self Care | Payer: BC Managed Care – PPO | Source: Ambulatory Visit | Attending: Internal Medicine | Admitting: Internal Medicine

## 2014-01-02 DIAGNOSIS — D509 Iron deficiency anemia, unspecified: Secondary | ICD-10-CM | POA: Insufficient documentation

## 2014-01-02 DIAGNOSIS — I471 Supraventricular tachycardia: Secondary | ICD-10-CM

## 2014-01-02 DIAGNOSIS — M5412 Radiculopathy, cervical region: Secondary | ICD-10-CM | POA: Insufficient documentation

## 2014-01-02 HISTORY — PX: SUPRAVENTRICULAR TACHYCARDIA ABLATION: SHX5492

## 2014-01-02 LAB — POCT ACTIVATED CLOTTING TIME
Activated Clotting Time: 242 seconds
Activated Clotting Time: 259 seconds
Activated Clotting Time: 124 seconds

## 2014-01-02 LAB — MRSA PCR SCREENING: MRSA by PCR: NEGATIVE

## 2014-01-02 SURGERY — SUPRAVENTRICULAR TACHYCARDIA ABLATION
Anesthesia: Monitor Anesthesia Care

## 2014-01-02 MED ORDER — BUPIVACAINE HCL (PF) 0.25 % IJ SOLN
INTRAMUSCULAR | Status: AC
  Filled 2014-01-02: qty 30

## 2014-01-02 MED ORDER — ADENOSINE 6 MG/2ML IV SOLN
INTRAVENOUS | Status: AC
  Filled 2014-01-02: qty 2

## 2014-01-02 MED ORDER — FENTANYL CITRATE 0.05 MG/ML IJ SOLN
INTRAMUSCULAR | Status: AC
  Filled 2014-01-02: qty 2

## 2014-01-02 MED ORDER — ONDANSETRON HCL 4 MG/2ML IJ SOLN: 4.0000 mg | Freq: Four times a day (QID) | INTRAMUSCULAR | Status: DC | PRN

## 2014-01-02 MED ORDER — HEPARIN SODIUM (PORCINE) 1000 UNIT/ML IJ SOLN
INTRAMUSCULAR | Status: AC
  Filled 2014-01-02: qty 1

## 2014-01-02 MED ORDER — SODIUM CHLORIDE 0.9 % IV SOLN
INTRAVENOUS | Status: DC
  Administered 2014-01-02: 13:00:00 via INTRAVENOUS

## 2014-01-02 MED ORDER — SODIUM CHLORIDE 0.9 % IJ SOLN
3.0000 mL | Freq: Two times a day (BID) | INTRAMUSCULAR | Status: DC
  Administered 2014-01-02: 3 mL via INTRAVENOUS

## 2014-01-02 MED ORDER — ACETAMINOPHEN 325 MG PO TABS
650.0000 mg | ORAL_TABLET | ORAL | Status: DC | PRN
  Administered 2014-01-02: 650 mg via ORAL
  Filled 2014-01-02: qty 2

## 2014-01-02 MED ORDER — SODIUM CHLORIDE 0.9 % IV SOLN: 250.0000 mL | INTRAVENOUS | Status: DC | PRN

## 2014-01-02 MED ORDER — MIDAZOLAM HCL 5 MG/5ML IJ SOLN
INTRAMUSCULAR | Status: AC
  Filled 2014-01-02: qty 5

## 2014-01-02 MED ORDER — PROTAMINE SULFATE 10 MG/ML IV SOLN
INTRAVENOUS | Status: AC
  Filled 2014-01-02: qty 5

## 2014-01-02 MED ORDER — APIXABAN 5 MG PO TABS
5.0000 mg | ORAL_TABLET | Freq: Two times a day (BID) | ORAL | Status: DC
  Administered 2014-01-02 – 2014-01-03 (×2): 5 mg via ORAL
  Filled 2014-01-02 (×3): qty 1

## 2014-01-02 MED ORDER — SODIUM CHLORIDE 0.9 % IJ SOLN: 3.0000 mL | INTRAMUSCULAR | Status: DC | PRN

## 2014-01-02 NOTE — Op Note (Signed)
SURGEON:  Hillis RangeJames Kirstan Fentress, MD  PREPROCEDURE DIAGNOSES: 1. SVT  POSTPROCEDURE DIAGNOSES: 1. Ectopic atrial tachycardia  PROCEDURES: 1. Comprehensive electrophysiologic study. 2. Coronary sinus pacing and recording. 3. Three-dimensional mapping of SVT 4. Ablation of SVT 5. Intracardiac echocardiography. 6. Transseptal puncture of an intact septum. 7. Arrhythmia induction with pacing with adenosine infusion  INTRODUCTION:  Leah MillardStephanie M Armstrong is a 48 y.o. female with a history of recurrent symptomatic SVt who now presents for EP study and radiofrequency ablation.  The patient reports initially being diagnosed with SVT after presenting with symptomatic palpitations and fatgiue. The patient reports increasing frequency and duration of SVT since that time.  The patient has failed medical therapy with verapamil.  Her arrhythmia is now incessant.  The patient therefore presents today for catheter ablation of SVT.  DESCRIPTION OF PROCEDURE:  Informed written consent was obtained, and the patient was brought to the electrophysiology lab in a fasting state.  The patient was adequately sedated with intravenous medications as outlined in the nursing report.  The patient's left and right groins were prepped and draped in the usual sterile fashion by the EP lab staff.  Using a percutaneous Seldinger technique, two 7-French and one 11-French hemostasis sheaths were placed into the right common femoral vein.  Catheter Placement:  A 7-French Biosense Webster Decapolar coronary sinus catheter was introduced through the right common femoral vein and advanced into the coronary sinus for recording and pacing from this location.  A 6-French quadripolar Josephson catheter was introduced through the right common femoral vein and advanced into the right ventricle for recording and pacing.  This catheter was then pulled back to the His bundle location.    Initial Measurements: The patient presented to the  electrophysiology lab in SVT.  The SVT cycle length was 340 msec.  The earliest atrial activation was recorded from the distal CS and was suggestive of a left atrial source.  V pacing during tachycardia revealed a VAAV response.  At times, 2:1 AV conduction was observed.   Her QRS duration was 114 msec.  The HV interval measured 46 msec.   A 3.5 mm Biosense Webster SmartTouch catheter was inserted through the right common femoral vein and advanced into the right atrium.  Mapping revealed only later activations within the right atrium.  The fossa ovalis was probed however there was no PFO today.  I therefore elected to perform transseptal puncture.  Intracardiac Echocardiography: A 10-French Biosense Webster AcuNav intracardiac echocardiography catheter was introduced through the left common femoral vein and advanced into the right atrium. Intracardiac echocardiography was performed of the left atrium, and a three-dimensional anatomical rendering of the left atrium was performed using CARTO sound technology.  The patient was noted to have a moderate sized left atrium.  The interatrial septum was prominent but not aneurysmal. All 4 pulmonary veins were visualized and noted to have separate ostia.  The pulmonary veins were moderate in size.  The left atrial appendage was visualized and did not reveal thrombus.   There was no evidence of pulmonary vein stenosis.   Transseptal Puncture: The middle right common femoral vein sheath was exchanged for an 8.5 JamaicaFrench SL2 transseptal sheath and transseptal access was achieved in a standard fashion using a Brockenbrough needle under biplane fluoroscopy with intracardiac echocardiography confirmation of the transseptal puncture.  Once transseptal access had been achieved, heparin was administered intravenously and intra- arterially in order to maintain an ACT of greater than 300 seconds throughout the procedure.   3D Mapping  and Ablation: The transseptal sheath was  pulled back into the IVC over a guidewire.  The ablation catheter was advanced across the transseptal hole using the wire as a guide.  The transseptal sheath was then re-advanced over the guidewire into the left atrium.  A duodecapolar Biosense Webster circular mapping catheter was introduced through the transseptal sheath and positioned within the left atrium.  Three-dimensional electroanatomical mapping was performed using CARTO technology.  This demonstrated that the ectopic atrial tachycardia focus arose from the left superior pulmonary vein.  Activations were clearly earliest in this location.  As the focus arose from distal within the vein, I elected to isolate the vein rather than perform focal ablation due to risks of PV stenosis with ablation within the vein.  The patient underwent successful electrical isolation and anatomical encircling of the left superior pulmonary vein using radiofrequency current with a circular mapping catheter as a guide.  Tachycardia slowed and then terminated with isolation of the vein.  She was observed for 20 minutes without return of conduction from the vein.  Measurements Following Ablation: Following ablation, adenosine was infused with no return of conduction within the pulmonary vein.  In sinus rhythm with RR interval was 665 msec, with PR 155 msec, QRS 84 msec, and Qtc 373 msec.  Following ablation the AH interval measured 100 msec with an HV interval of 46 msec. Ventricular pacing was performed, which revealed midline decremental VA conduction with a VA Wenckebach cycle length of 320 msec. VEST was performed which revealed no retrograde jumps echo beats or tachycardias.  The VERP was 500/220 msec. Rapid atrial pacing was performed, which revealed an AV Wenckebach cycle length of 310 msec. With pacing down to a CL of 250 msec no arrhythmias were observed.  AEST was performed which revealed no AH jumps, echo beats, or tachycardias.  The AERP was 500/240 msec.   Electroisolation was then again confirmed in all four pulmonary veins.  Pacing was performed along the ablation line which confirmed entrance and exit block.  The procedure was therefore considered completed.  All catheters were removed, and the sheaths were aspirated and flushed.  The patient was transferred to the recovery area for sheath removal per protocol. EBL<4110ml.  A limited bedside transthoracic echocardiogram revealed no pericardial effusion.  There were no early apparent complications.  CONCLUSIONS: 1. Ectopic atrial tachycardia upon presentation arising from the left superior pulmonary vein 2. Successful electrical isolation and anatomical encircling of the left superior pulmonary vein with radiofrequency current with termination of tachycardia. 3. No inducible arrhythmias following ablation.  No accessory pathways or evidence of dual AV nodal physiology 4. No early apparent complications.   Nosson Wender,MD 5:22 PM 01/02/2014

## 2014-01-02 NOTE — Progress Notes (Signed)
   PCP:  Talia Aron, MD Primary Cardiologist: Arida  The patient presents today for electrophysiology followup. She has recently developed recurrent tachypalpitations.  These are abrupt on onset/ offset.  She is unaware of triggers or precipitants.  She denies syncope.  She finds that she has chest tightness and SOB with these episodes.  She also has an associated headache.  Today her episode began in the afternoon and has persisted.  She has been taking her verapamil without relief.   Past Medical History  Diagnosis Date  . BACK PAIN 05/19/2010  . CERVICAL RADICULOPATHY 05/25/2009  . COLONIC POLYPS, HX OF 09/28/2007  . Diarrhea 09/28/2007  . Iron deficiency 11/01/2010  . SVT (supraventricular tachycardia)   . Syncope    Past Surgical History  Procedure Laterality Date  . 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 Abdominal hysterectomy      ovaries intact   ROS- all systems are reviewed and negative except as per HPI above  No current facility-administered medications for this visit.   No current outpatient prescriptions on file.   Facility-Administered Medications Ordered in Other Visits  Medication Dose Route Frequency Provider Last Rate Last Dose  . 0.9 %  sodium chloride infusion   Intravenous Continuous Hayley Horn, MD 100 mL/hr at 01/02/14 1246      Allergies  Allergen Reactions  . Levofloxacin Rash    Rash all over body    History   Social History  . Marital Status: Married    Spouse Name: N/A    Number of Children: N/A  . Years of Education: N/A   Occupational History  . [employer] cafeteria manager    Social History Main Topics  . Smoking status: Former Smoker    Types: Cigarettes  . Smokeless tobacco: Never Used  . Alcohol Use: No  . Drug Use: No  . Sexual Activity: Not on file   Other Topics Concern  . Not on file   Social History Narrative   Married 1991   Lives in [place] with spouse.     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 Cancer Mother     ovarian cancer  . Cancer Other     breast cancer  . Breast cancer Other   . Cancer Other     breast cancer  . Coronary artery disease Other   . Heart attack Other   . Breast cancer Other   . Colon cancer Neg Hx   . Heart attack Maternal Grandmother     Physical Exam: Filed Vitals:   01/01/14 1621  BP: 102/84  Pulse: 171  Height: 5' 2" (1.575 m)  Weight: 149 lb 1.9 oz (67.64 kg)    GEN- The patient is well appearing, alert and oriented x 3 today.   Head- normocephalic, atraumatic Eyes-  Sclera clear, conjunctiva pink Ears- hearing intact Oropharynx- clear Neck- supple, no JVP Lymph- no cervical lymphadenopathy Lungs- Clear to ausculation bilaterally, normal work of breathing Heart- tachycardic regular rhythm, no mumurs GI- soft, NT, ND, + BS Extremities- no clubbing, cyanosis, or edema Neuro- strength and sensation are intact  EKG- mid RP narrow complex SVt at 170 bpm, carotid massage maneuver is performed and will slow only transiently but not terminate the tachycardia. Echo 3/14 is reviewed and normal  Assessment and Plan:  1. SVT- the patient has severe and incessant SVT.    I am concerned that she could become quite ill with this.  Her risk of hospitalization is very high.  I also worry about a tachycardia mediated cardiomyopathy.  I have advised admission but she declines.  I have shown her strips to Dr Ladona Ridgelaylor and we have reviewed together--> we suspect an atrial tachycardia or atrial flutter as the cause.  As this has been present < 24 hours, I will not start anticoagulation at this time.  Therapeutic strategies for supraventricular tachycardia including medicine and ablation were discussed in detail with the patient today. Risk, benefits, and alternatives to EP study and radiofrequency ablation were also discussed in detail today. These risks include but are  not limited to stroke, bleeding, vascular damage, tamponade, perforation, damage to the heart and other structures, AV block requiring pacemaker, worsening renal function, and death. The patient understands these risk and wishes to proceed.  We will therefore proceed with catheter ablation at the next available time.  I will re-arrange my schedule to allow for us to proceed with her ablation in the AM.  Repeat echo once in sinus.

## 2014-01-02 NOTE — Interval H&P Note (Signed)
History and Physical Interval Note:  01/02/2014 2:20 PM  Leah Armstrong  has presented today for surgery, with the diagnosis of SVT  The various methods of treatment have been discussed with the patient and family. After consideration of risks, benefits and other options for treatment, the patient has consented to  Procedure(s): SUPRAVENTRICULAR TACHYCARDIA ABLATION (N/A) as a surgical intervention .  The patient's history has been reviewed, patient examined, no change in status, stable for surgery.  I have reviewed the patient's chart and labs.  Questions were answered to the patient's satisfaction.     Hillis RangeAllred, [name]

## 2014-01-02 NOTE — Progress Notes (Signed)
Dr Johney FrameAllred in to see pt.  Family at bedside

## 2014-01-02 NOTE — H&P (View-Only) (Signed)
PCP:  Ruthe Mannanalia Aron, MD Primary Cardiologist: Kirke Corinrida  The patient presents today for electrophysiology followup. She has recently developed recurrent tachypalpitations.  These are abrupt on onset/ offset.  She is unaware of triggers or precipitants.  She denies syncope.  She finds that she has chest tightness and SOB with these episodes.  She also has an associated headache.  Today her episode began in the afternoon and has persisted.  She has been taking her verapamil without relief.   Past Medical History  Diagnosis Date  . BACK PAIN 05/19/2010  . CERVICAL RADICULOPATHY 05/25/2009  . COLONIC POLYPS, HX OF 09/28/2007  . Diarrhea 09/28/2007  . Iron deficiency 11/01/2010  . SVT (supraventricular tachycardia)   . Syncope    Past Surgical History  Procedure Laterality Date  . 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 Abdominal hysterectomy      ovaries intact   ROS- all systems are reviewed and negative except as per HPI above  No current facility-administered medications for this visit.   No current outpatient prescriptions on file.   Facility-Administered Medications Ordered in Other Visits  Medication Dose Route Frequency Provider Last Rate Last Dose  . 0.9 %  sodium chloride infusion   Intravenous Continuous Hillis RangeJames Julien Oscar, MD 100 mL/hr at 01/02/14 1246      Allergies  Allergen Reactions  . Levofloxacin Rash    Rash all over body    History   Social History  . Marital Status: Married    Spouse Name: N/A    Number of Children: N/A  . Years of Education: N/A   Occupational History  . 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 Smoking status: Former Smoker    Types: Cigarettes  . Smokeless tobacco: Never Used  . Alcohol Use: No  . Drug Use: No  . Sexual Activity: Not on file   Other Topics Concern  . Not on file   Social History Narrative   Married 1991   [place] in [place].     2 kids   [employer]afeteria manager [employer]    Family History  Problem Relation Age of Onset  . Cancer Mother     ovarian cancer  . Cancer Other     breast cancer  . Breast cancer Other   . Cancer Other     breast cancer  . Coronary artery disease Other   . Heart attack Other   . Breast cancer Other   . Colon cancer Neg Hx   . Heart attack Maternal Grandmother     Physical Exam: Filed Vitals:   01/01/14 1621  BP: 102/84  Pulse: 171  Height: 5\' 2"  (1.575 m)  Weight: 149 lb 1.9 oz (67.64 kg)    GEN- The patient is well appearing, alert and oriented x 3 today.   Head- normocephalic, atraumatic Eyes-  Sclera clear, conjunctiva pink Ears- hearing intact Oropharynx- clear Neck- supple, no JVP Lymph- no cervical lymphadenopathy Lungs- Clear to ausculation bilaterally, normal work of breathing Heart- tachycardic regular rhythm, no mumurs GI- soft, NT, ND, + BS Extremities- no clubbing, cyanosis, or edema Neuro- strength and sensation are intact  EKG- mid RP narrow complex SVt at 170 bpm, carotid massage maneuver is performed and will slow only transiently but not terminate the tachycardia. Echo 3/14 is reviewed and normal  Assessment and Plan:  1. SVT- the patient has severe and incessant SVT.  I am concerned that she could become quite ill with this.  Her risk of hospitalization is very high.  I also worry about a tachycardia mediated cardiomyopathy.  I have advised admission but she declines.  I have shown her strips to Dr Ladona Ridgelaylor and we have reviewed together--> we suspect an atrial tachycardia or atrial flutter as the cause.  As this has been present < 24 hours, I will not start anticoagulation at this time.  Therapeutic strategies for supraventricular tachycardia including medicine and ablation were discussed in detail with the patient today. Risk, benefits, and alternatives to EP study and radiofrequency ablation were also discussed in detail today. These risks include but are  not limited to stroke, bleeding, vascular damage, tamponade, perforation, damage to the heart and other structures, AV block requiring pacemaker, worsening renal function, and death. The patient understands these risk and wishes to proceed.  We will therefore proceed with catheter ablation at the next available time.  I will re-arrange my schedule to allow for us to proceed with her ablation in the AM.  Repeat echo once in sinus.

## 2014-01-02 NOTE — Discharge Summary (Signed)
ELECTROPHYSIOLOGY PROCEDURE DISCHARGE SUMMARY    Patient ID: Leah Armstrong,  MRN: [id number], DOB/AGE: 1965-11-10 48 y.o.  Admit date: 01/02/2014 Discharge date: 01/03/2014  Primary Care Physician: Ruthe Mannanalia Aron, MD Primary Cardiologist: Kirke CorinArida Electrophysiologist: Yehya Brendle  Primary Discharge Diagnosis:  Ectopic atrial tachycardia status post ablation this admission  Secondary Discharge Diagnosis:  1.  Iron deficiency anemia 2.  Back pain with cervical radiculopathy 3.  Prior syncope  Allergies  Allergen Reactions  . Levofloxacin Rash    Rash all over body     Procedures This Admission:  1.  Electrophysiology study and radiofrequency catheter ablation on 01-02-14 by Dr Johney FrameAllred.  This study demonstrated ectopic atrial tachycardia upon presentation arising from the left superior pulmonary vein; successful electrical isolation and anatomical encircling of the left superior pulmonary vein with radiofrequency current with termination of tachycardia; no inducible arrhythmias following ablation. No accessory pathways or evidence of dual AV nodal physiology; no early apparent complications  Brief HPI: Leah MillardStephanie M Armstrong is a 48 y.o. female with a history of tachy-palpitations.  These have become more frequent and persistent. She has failed medical therapy with Verapamil. Risks, benefits, and alternatives to electrophysiology study and ablation were reviewed with the patient who wished to proceed.   Hospital Course:  The patient was admitted and underwent EPS/RFCA with details as outlined above.  She was monitored on telemetry overnight which demonstrated sinus rhythm.  Her groins were without complication.  She was evaluated by Dr Johney FrameAllred and considered stable for discharge to home.  Echocardiogram was ordered prior to discharge.  Because her atrial tachycardia arose from a pulmonary vein, she will be placed on Eliquis 5mg  1 tablet twice daily for at least 8 weeks at discharge.      She will follow-up with Dr Johney FrameAllred in 4 weeks post ablation.   Discharge Vitals: Blood pressure 102/59, pulse 94, temperature 99.1 F (37.3 C), temperature source Oral, resp. rate 22, height 5\' 2"  (1.575 m), weight 149 lb (67.586 kg), SpO2 100.00%.  Physical Exam: Filed Vitals:   01/03/14 0300 01/03/14 0400 01/03/14 0500 01/03/14 0600  BP: 83/58 86/56 88/58  102/59  Pulse: 79 76 74 94  Temp: 99.1 F (37.3 C)     TempSrc: Oral     Resp: 17 18 19 22   Height:      Weight:      SpO2: 99% 99% 99% 100%    GEN- The patient is well appearing, alert and oriented x 3 today.   Head- normocephalic, atraumatic Eyes-  Sclera clear, conjunctiva pink Ears- hearing intact Oropharynx- clear Neck- supple, Lungs- Clear to ausculation bilaterally, normal work of breathing Heart- Regular rate and rhythm, no murmurs, rubs or gallops, PMI not laterally displaced GI- soft, NT, ND, + BS Extremities- no clubbing, cyanosis, or edema, groin is without hematoma/ bruit MS- no significant deformity or atrophy Skin- no rash or lesion Psych- euthymic mood, full affect Neuro- strength and sensation are intact   Labs:   Lab Results  Component Value Date   WBC 10.9* 01/01/2014   HGB 13.8 01/01/2014   HCT 39.6 01/01/2014   MCV 89.4 01/01/2014   PLT 377 01/01/2014     Recent Labs Lab 01/01/14 1703  NA 139  K 3.9  CL 105  CO2 27  BUN 11  CREATININE 0.81  CALCIUM 9.3  GLUCOSE 93     Discharge Medications:    Medication List    STOP taking these medications  verapamil 120 MG CR tablet  Commonly known as:  CALAN-SR      TAKE these medications       acetaminophen 500 MG tablet  Commonly known as:  TYLENOL  Take 500 mg by mouth every 6 (six) hours as needed (pain).     apixaban 5 MG Tabs tablet  Commonly known as:  ELIQUIS  Take 1 tablet (5 mg total) by mouth 2 (two) times daily.     NON FORMULARY  Women's Vitafusion Vitamins - Take 2 tablets by mouth once a day      PSYLLIUM HUSK PO  Take 1 tablet by mouth daily.     Vitamin D3 2000 UNITS Tabs  Take 1 tablet by mouth daily.        Disposition:   Follow-up Information   Follow up with Hillis RangeAllred, Jaxon Mynhier, MD On 02/03/2014. (4pm)    Specialty:  Cardiology   Contact information:   [address] [place] [address] [phone]       Duration of Discharge Encounter: Greater than 30 minutes including physician time.  Signed,   Hillis RangeJames Nyema Hachey MD

## 2014-01-02 NOTE — Progress Notes (Signed)
Venous sheaths X 3 aspirated and removed from RFV. Manual pressure applied for 25 minutes. BP 91/61 hr 95 spo2 100%. No s+s of hematoma. Tegaderm dressing applied, bedrest instructions given. Bilateral dp pulses easily palpable. Rolled blanket placed under both knees for lower back pain relief.

## 2014-01-03 ENCOUNTER — Encounter: Payer: BC Managed Care – PPO | Admitting: Family Medicine

## 2014-01-03 DIAGNOSIS — M5412 Radiculopathy, cervical region: Secondary | ICD-10-CM | POA: Diagnosis not present

## 2014-01-03 DIAGNOSIS — I471 Supraventricular tachycardia: Secondary | ICD-10-CM | POA: Diagnosis not present

## 2014-01-03 DIAGNOSIS — D509 Iron deficiency anemia, unspecified: Secondary | ICD-10-CM | POA: Diagnosis not present

## 2014-01-03 MED ORDER — APIXABAN 5 MG PO TABS: 5.0000 mg | ORAL_TABLET | Freq: Two times a day (BID) | ORAL | Status: DC

## 2014-01-03 MED ORDER — ALUM & MAG HYDROXIDE-SIMETH 200-200-20 MG/5ML PO SUSP
15.0000 mL | Freq: Once | ORAL | Status: AC
  Administered 2014-01-03: 15 mL via ORAL
  Filled 2014-01-03: qty 30

## 2014-01-03 NOTE — Care Management Note (Signed)
    Page 1 of 1   01/03/2014     8:38:56 AM CARE MANAGEMENT NOTE 01/03/2014  Patient:  Leah Armstrong,Leah Armstrong   Account Number:  [id number]  Date Initiated:  01/03/2014  Documentation initiated by:  Junius CreamerWELL,DEBBIE  Subjective/Objective Assessment:   adm w svt     Action/Plan:   lives w husband, pcp dt talia aron   Anticipated DC Date:  01/03/2014   Anticipated DC Plan:  HOME/SELF CARE      DC Planning Services  CM consult  Medication Assistance      Choice offered to / List presented to:             Status of service:   Medicare Important Message given?   (If response is "NO", the following Medicare IM given date fields will be blank) Date Medicare IM given:   Medicare IM given by:   Date Additional Medicare IM given:   Additional Medicare IM given by:    Discharge Disposition:  HOME/SELF CARE  Per UR Regulation:  Reviewed for med. necessity/level of care/duration of stay  If discussed at Long Length of Stay Meetings, dates discussed:    Comments:  10/23 0837 debbie Leah Barefoot rn,bsn gave pt 30day free and copay assit card for eliquis.

## 2014-01-03 NOTE — Progress Notes (Signed)
  Echocardiogram 2D Echocardiogram has been performed.  Georgian CoWILLIAMS, Azarah Dacy 01/03/2014, 9:33 AM

## 2014-01-03 NOTE — Discharge Instructions (Signed)
No driving for 5 days. No lifting over 5 lbs for 1 week. No sexual activity for 1 week. You may return to work in 1 week. Keep procedure site clean & dry. If you notice increased pain, swelling, bleeding or pus, call/return!  You may shower, but no soaking baths/hot tubs/pools for 1 week.  ° ° °

## 2014-01-06 ENCOUNTER — Telehealth: Payer: Self-pay | Admitting: Internal Medicine

## 2014-01-06 NOTE — Telephone Encounter (Signed)
Spoke with patient she has pressure in head and under eyes like a sinus infection.  I have asked her to call her PCP to be seen prior to her already scheduled appointment on Wed.

## 2014-01-06 NOTE — Telephone Encounter (Signed)
New message     Pt had a ablation on last thurs.  What can she take for her sinus drainage?  She is coughing and has a little blood in the phlem.  She is on eliquis.

## 2014-01-07 ENCOUNTER — Ambulatory Visit (INDEPENDENT_AMBULATORY_CARE_PROVIDER_SITE_OTHER): Payer: BC Managed Care – PPO | Admitting: Family Medicine

## 2014-01-07 ENCOUNTER — Encounter: Payer: Self-pay | Admitting: Family Medicine

## 2014-01-07 VITALS — BP 114/68 | HR 82 | Temp 97.8°F | Wt 147.8 lb

## 2014-01-07 DIAGNOSIS — J0110 Acute frontal sinusitis, unspecified: Secondary | ICD-10-CM

## 2014-01-07 DIAGNOSIS — L259 Unspecified contact dermatitis, unspecified cause: Secondary | ICD-10-CM

## 2014-01-07 DIAGNOSIS — J019 Acute sinusitis, unspecified: Secondary | ICD-10-CM | POA: Insufficient documentation

## 2014-01-07 MED ORDER — FLUTICASONE PROPIONATE 50 MCG/ACT NA SUSP: 2.0000 | Freq: Every day | NASAL | Status: DC

## 2014-01-07 MED ORDER — AMOXICILLIN 875 MG PO TABS: 875.0000 mg | ORAL_TABLET | Freq: Two times a day (BID) | ORAL | Status: DC

## 2014-01-07 NOTE — Assessment & Plan Note (Signed)
New- likely sensitivity to BP cuff. Improving.  Use OTC hydrocortisone twice daily until rash resolves- no more than 1 week with updating me. Call or return to clinic prn if these symptoms worsen or fail to improve as anticipated. The patient indicates understanding of these issues and agrees with the plan.

## 2014-01-07 NOTE — Assessment & Plan Note (Signed)
Given duration and progression of symptoms, will treat for bacterial sinusitis. Discussed importance of looking for bleeding on abx while taking blood thinner, especially one that is not reversible. eRx sent for 10 day course of amoxicillin twice daily and flonase. Call or return to clinic prn if these symptoms worsen or fail to improve as anticipated. The patient indicates understanding of these issues and agrees with the plan.

## 2014-01-07 NOTE — Progress Notes (Signed)
Pre visit review using our clinic review tool, if applicable. No additional management support is needed unless otherwise documented below in the visit note. 

## 2014-01-07 NOTE — Progress Notes (Signed)
Subjective:    Patient ID: Leah MillardStephanie M Zeoli, female    DOB: Feb 22, 1966, 48 y.o.   MRN: [id number]  HPI  Very pleasant female with complicated medical history, new to me here for two issues.  She had an cardiac ablation done last week (Dr. Johney FrameAllred- note reviewed). She is currently taking Eliquis s/p ablation.  After discharge, she developed runny nose, congestion, cough and sinus pressure.  Symptoms, especially sinus pressure/tooth pain has worsened.  No fever.  No SOB.  No CP. Using Netty pot.  Rash on right arm- itchy raised confluent rash.  No changes in soaps or detergents.  Rash in same distribution as her BP cuff was on her arm while she was on the hospital.  Current Outpatient Prescriptions on File Prior to Visit  Medication Sig Dispense Refill  . acetaminophen (TYLENOL) 500 MG tablet Take 500 mg by mouth every 6 (six) hours as needed (pain).       Marland Kitchen. apixaban (ELIQUIS) 5 MG TABS tablet Take 1 tablet (5 mg total) by mouth 2 (two) times daily.  60 tablet  1  . Cholecalciferol (VITAMIN D3) 2000 UNITS TABS Take 1 tablet by mouth daily.      . NON FORMULARY Women's Vitafusion Vitamins - Take 2 tablets by mouth once a day      . PSYLLIUM HUSK PO Take 1 tablet by mouth daily.       No current facility-administered medications on file prior to visit.    Allergies  Allergen Reactions  . Levofloxacin Rash    Rash all over body    Past Medical History  Diagnosis Date  . BACK PAIN 05/19/2010  . CERVICAL RADICULOPATHY 05/25/2009  . COLONIC POLYPS, HX OF 09/28/2007  . Diarrhea 09/28/2007  . Iron deficiency 11/01/2010  . SVT (supraventricular tachycardia)   . Syncope     Past Surgical History  Procedure Laterality Date  . 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 Abdominal hysterectomy      ovaries intact    Family History  Problem Relation Age of Onset  . Cancer Mother     ovarian cancer  . Cancer Other     breast cancer  . Breast cancer Other   . Cancer Other    breast cancer  . Coronary artery disease Other   . Heart attack Other   . Breast cancer Other   . Colon cancer Neg Hx   . Heart attack Maternal Grandmother     History   Social History  . Marital Status: Married    Spouse Name: N/A    Number of Children: N/A  . Years of Education: N/A   Occupational History  . 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 Smoking status: Former Smoker    Types: Cigarettes  . Smokeless tobacco: Never Used  . Alcohol Use: No  . Drug Use: No  . Sexual Activity: Not on file   Other Topics Concern  . Not on file   Social History Narrative   Married 1991   [place].     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     Review of Systems  Constitutional: Positive for fatigue. Negative for fever and chills.  HENT: Positive for congestion, ear pain, postnasal drip, sinus  pressure and sore throat. Negative for ear discharge, facial swelling and trouble swallowing.   Respiratory: Positive for cough. Negative for shortness of breath and wheezing.   Cardiovascular: Negative for chest pain.  Neurological: Negative.   All other systems reviewed and are negative.      Objective:   Physical Exam  Nursing note and vitals reviewed. Constitutional: She appears well-developed and well-nourished. No distress.  HENT:  Head: Normocephalic.  Right Ear: Hearing and tympanic membrane normal.  Left Ear: Hearing normal. A middle ear effusion is present.  Nose: Rhinorrhea and sinus tenderness present. No mucosal edema or nasal septal hematoma. No epistaxis. Right sinus exhibits frontal sinus tenderness. Right sinus exhibits no maxillary sinus tenderness. Left sinus exhibits frontal sinus tenderness. Left sinus exhibits no maxillary sinus  tenderness.  Cardiovascular: Normal rate.   Pulmonary/Chest: Effort normal and breath sounds normal. No respiratory distress. She has no wheezes. She has no rales. She exhibits no tenderness.  Skin:     Psychiatric: She has a normal mood and affect. Her speech is normal and behavior is normal. Judgment and thought content normal. Cognition and memory are normal.     BP 114/68  Pulse 82  Temp(Src) 97.8 F (36.6 C) (Oral)  Wt 147 lb 12 oz (67.019 kg)  SpO2 96%      Assessment & Plan:

## 2014-01-07 NOTE — Patient Instructions (Addendum)
Nice to meet you. Use over the counter hydrocortisone cream- twice a day until it resolves- should not use for more than 1 week without letting us know. Take amoxicillin as directed- 1 tablet twice daily for 10 days, flonase for sinus pressure.  Please update me later this week- look out for bleeding like we discussed.

## 2014-01-08 ENCOUNTER — Encounter: Payer: BC Managed Care – PPO | Admitting: Family Medicine

## 2014-01-14 ENCOUNTER — Telehealth: Payer: Self-pay | Admitting: Internal Medicine

## 2014-01-14 NOTE — Telephone Encounter (Signed)
New message ° ° ° ° ° ° ° ° ° °Pt would like for kelly to give her a call  °

## 2014-01-14 NOTE — Telephone Encounter (Signed)
Called because today was her first full day back to work and she left work went to [employer].  While unloading her car she had a sharp pain in her chest and it went away quickly and her arm went numb.  She is fine now.  She called just to report and make sure nothing to do.  I let her know I felt she was okay but to call back if this happened again

## 2014-01-17 ENCOUNTER — Telehealth: Payer: Self-pay | Admitting: Physician Assistant

## 2014-01-17 NOTE — Telephone Encounter (Signed)
Patient's daughter called the answering service this evening with question from the patient. On Monday she was pushing her grocery out to the car when she felt a sharp pain in her chest down her left arm, then arm went numb. Per telephone note, symptoms had resolved. She had called our office and was advised to observe for recurrent symptoms. Since that time the patient has apparently had on and off sharp chest pain as well as weakness in her left arm and inability to raise it up. I am not sure what's going on - not clear that the weakness is related to the procedure, but I do think the CP warrants evaluation. They are quite concerned about the persistence of the symptoms. To be on the safe side I advised they proceed to ER for evaluation since it is difficult to evaluate this kind of thing on the phone.  She verbalized understanding and gratitude and will pass on this info to her mom beside her on the phone. Dayna Dunn PA-C

## 2014-02-03 ENCOUNTER — Ambulatory Visit (INDEPENDENT_AMBULATORY_CARE_PROVIDER_SITE_OTHER): Payer: BC Managed Care – PPO | Admitting: Internal Medicine

## 2014-02-03 ENCOUNTER — Encounter: Payer: Self-pay | Admitting: Internal Medicine

## 2014-02-03 VITALS — BP 110/70 | HR 69 | Ht 62.0 in | Wt 149.1 lb

## 2014-02-03 DIAGNOSIS — I471 Supraventricular tachycardia: Secondary | ICD-10-CM

## 2014-02-03 NOTE — Patient Instructions (Signed)
Your physician recommends that you schedule a follow-up appointment as needed   Your physician has recommended you make the following change in your medication:  1) Stop Eliquis after 4 weeks

## 2014-02-03 NOTE — Progress Notes (Signed)
PCP: Ruthe Mannanalia Aron, MD  Leah Armstrong is a 48 y.o. female who presents today for routine electrophysiology followup.  Since her SVT ablation, the patient reports doing very well.  She denies any symptoms of arrhythmia.  She denies procedure related complications.  Today, she denies symptoms of palpitations, chest pain, shortness of breath,  lower extremity edema, dizziness, presyncope, or syncope.  The patient is otherwise without complaint today.   Past Medical History  Diagnosis Date  . BACK PAIN 05/19/2010  . CERVICAL RADICULOPATHY 05/25/2009  . COLONIC POLYPS, HX OF 09/28/2007  . Diarrhea 09/28/2007  . Iron deficiency 11/01/2010  . SVT (supraventricular tachycardia)   . Syncope    Past Surgical History  Procedure Laterality Date  . 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 Abdominal hysterectomy      ovaries intact  . 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 acetaminophen (TYLENOL) 500 MG tablet Take 500 mg by mouth every 6 (six) hours as needed (pain).     . Cholecalciferol (VITAMIN D3) 2000 UNITS TABS Take 1 tablet by mouth daily.    . NON FORMULARY Women's Vitafusion Vitamins - Take 2 tablets by mouth once a day    . PSYLLIUM HUSK PO Take 1 tablet by mouth daily.     No current facility-administered medications for this visit.    Physical Exam: Filed Vitals:   02/03/14 1616  BP: 110/70  Pulse: 69  Height: 5\' 2"  (1.575 m)  Weight: 149 lb 1.9 oz (67.64 kg)    GEN- The patient is well appearing, alert and oriented x 3 today.   Head- normocephalic, atraumatic Eyes-  Sclera clear, conjunctiva pink Ears- hearing intact Oropharynx- clear Lungs- Clear to ausculation bilaterally, normal work of breathing Heart- Regular rate and rhythm, no murmurs,  rubs or gallops, PMI not laterally displaced GI- soft, NT, ND, + BS Extremities- no clubbing, cyanosis, or edema  ekg today reveals sinus rhythm, normal ekg  Assessment and Plan:  1. SVT Successfully ablated Continue eliquis x 4 more weeks then discontinue No anticoagulation therapy required long term, no ASA  I will see as needed going forward

## 2014-02-20 ENCOUNTER — Encounter (HOSPITAL_COMMUNITY): Payer: Self-pay | Admitting: Internal Medicine

## 2014-03-05 ENCOUNTER — Telehealth: Payer: Self-pay | Admitting: *Deleted

## 2014-03-05 NOTE — Telephone Encounter (Signed)
Spoke with patient and let her know okay to d/c

## 2014-03-05 NOTE — Telephone Encounter (Signed)
Patient wanted to know if it's ok to stay on blood thinner one more month to be safe. Will forward to nurse to advise.

## 2014-05-07 ENCOUNTER — Other Ambulatory Visit: Payer: Self-pay | Admitting: Family Medicine

## 2014-05-07 DIAGNOSIS — D649 Anemia, unspecified: Secondary | ICD-10-CM

## 2014-05-14 ENCOUNTER — Other Ambulatory Visit (INDEPENDENT_AMBULATORY_CARE_PROVIDER_SITE_OTHER): Payer: BC Managed Care – PPO

## 2014-05-14 DIAGNOSIS — D649 Anemia, unspecified: Secondary | ICD-10-CM

## 2014-05-14 LAB — CBC WITH DIFFERENTIAL/PLATELET
Basophils Absolute: 0 10*3/uL (ref 0.0–0.1)
Basophils Relative: 0.5 % (ref 0.0–3.0)
Eosinophils Absolute: 0.1 10*3/uL (ref 0.0–0.7)
Eosinophils Relative: 0.7 % (ref 0.0–5.0)
HCT: 35.3 % — ABNORMAL LOW (ref 36.0–46.0)
Hemoglobin: 12 g/dL (ref 12.0–15.0)
Lymphocytes Relative: 19.3 % (ref 12.0–46.0)
Lymphs Abs: 1.5 10*3/uL (ref 0.7–4.0)
MCHC: 34.2 g/dL (ref 30.0–36.0)
MCV: 90.5 fl (ref 78.0–100.0)
Monocytes Absolute: 0.6 10*3/uL (ref 0.1–1.0)
Monocytes Relative: 8.1 % (ref 3.0–12.0)
NEUTROS PCT: 71.4 % (ref 43.0–77.0)
Neutro Abs: 5.6 10*3/uL (ref 1.4–7.7)
PLATELETS: 303 10*3/uL (ref 150.0–400.0)
RBC: 3.9 Mil/uL (ref 3.87–5.11)
RDW: 12.6 % (ref 11.5–15.5)
WBC: 7.8 10*3/uL (ref 4.0–10.5)

## 2014-05-14 LAB — COMPREHENSIVE METABOLIC PANEL
ALBUMIN: 3.9 g/dL (ref 3.5–5.2)
ALT: 16 U/L (ref 0–35)
AST: 17 U/L (ref 0–37)
Alkaline Phosphatase: 45 U/L (ref 39–117)
BUN: 13 mg/dL (ref 6–23)
CO2: 27 meq/L (ref 19–32)
CREATININE: 0.94 mg/dL (ref 0.40–1.20)
Calcium: 9.1 mg/dL (ref 8.4–10.5)
Chloride: 105 mEq/L (ref 96–112)
GFR: 67.97 mL/min (ref >60.00)
GLUCOSE: 98 mg/dL (ref 70–99)
Potassium: 4.1 mEq/L (ref 3.5–5.1)
Sodium: 136 mEq/L (ref 135–145)
Total Bilirubin: 0.3 mg/dL (ref 0.2–1.2)
Total Protein: 6.9 g/dL (ref 6.0–8.3)

## 2014-05-14 LAB — LIPID PANEL
CHOL/HDL RATIO: 3
Cholesterol: 150 mg/dL (ref 0–200)
HDL: 50.2 mg/dL (ref >39.00)
LDL Cholesterol: 80 mg/dL (ref 0–99)
NonHDL: 99.8
Triglycerides: 98 mg/dL (ref 0.0–149.0)
VLDL: 19.6 mg/dL (ref 0.0–40.0)

## 2014-05-14 LAB — TSH: TSH: 1.23 u[IU]/mL (ref 0.35–4.50)

## 2014-05-15 ENCOUNTER — Encounter: Payer: BC Managed Care – PPO | Admitting: Family Medicine

## 2014-05-20 ENCOUNTER — Encounter: Payer: Self-pay | Admitting: Family Medicine

## 2014-05-20 ENCOUNTER — Ambulatory Visit (INDEPENDENT_AMBULATORY_CARE_PROVIDER_SITE_OTHER): Payer: BC Managed Care – PPO | Admitting: Family Medicine

## 2014-05-20 ENCOUNTER — Ambulatory Visit (INDEPENDENT_AMBULATORY_CARE_PROVIDER_SITE_OTHER)
Admission: RE | Admit: 2014-05-20 | Discharge: 2014-05-20 | Disposition: A | Discharge status: Home / Self Care | Payer: BC Managed Care – PPO | Source: Ambulatory Visit | Attending: Family Medicine | Admitting: Family Medicine

## 2014-05-20 VITALS — BP 108/62 | HR 77 | Temp 98.0°F | Ht 62.0 in | Wt 151.5 lb

## 2014-05-20 DIAGNOSIS — Z0000 Encounter for general adult medical examination without abnormal findings: Secondary | ICD-10-CM

## 2014-05-20 DIAGNOSIS — M545 Low back pain, unspecified: Secondary | ICD-10-CM

## 2014-05-20 DIAGNOSIS — Z86010 Personal history of colonic polyps: Secondary | ICD-10-CM

## 2014-05-20 DIAGNOSIS — Z01419 Encounter for gynecological examination (general) (routine) without abnormal findings: Secondary | ICD-10-CM

## 2014-05-20 NOTE — Assessment & Plan Note (Addendum)
Reviewed preventive care protocols, scheduled due services, and updated immunizations Discussed nutrition, exercise, diet, and healthy lifestyle.  BME done today.  Declined influenza vaccine.

## 2014-05-20 NOTE — Patient Instructions (Signed)
Great to see you. We will call you with your xray results. Please send us a copy of your mammogram.

## 2014-05-20 NOTE — Progress Notes (Addendum)
Subjective:   Patient ID: Leah MillardStephanie M Armstrong, female    DOB: 06-23-1965, 49 y.o.   MRN: [id number]  Leah Armstrong is a pleasant 49 y.o. year old female who presents to clinic today with Annual Exam  on 05/20/2014  HPI:  Established care with me in 12/2013.  I have not seen her since.  H/o SVT- cardiac ablation done last week (Dr. Johney FrameAllred- last saw him on 02/03/14- note reviewed).  Declines influenza vaccine. Tdap 09/18/2008 H/o colon polyps- last colonoscopy 10/15/2007- Dr. Christella HartiganJacobs- 10 year recall ( or 49 years old). Remote h/o hysterectomy. Mammogram 10/18/12 in Epic- per pt, had a 3 D mammogram within the past year.  She has been having more low back pain.  Over 6 years ago, had back surgery (she cannot remember who performed it) to remove "cyst on spine."  Pain feels similar.  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 Pain is always on right side.  Denies any sciatica or RLE weakness.   Lab Results  Component Value Date   CHOL 150 05/14/2014   HDL 50.20 05/14/2014   LDLCALC 80 05/14/2014   TRIG 98.0 05/14/2014   CHOLHDL 3 05/14/2014   Lab Results  Component Value Date   CREATININE 0.94 05/14/2014   Lab Results  Component Value Date   WBC 7.8 05/14/2014   HGB 12.0 05/14/2014   HCT 35.3* 05/14/2014   MCV 90.5 05/14/2014   PLT 303.0 05/14/2014   Lab Results  Component Value Date   WBC 7.8 05/14/2014   HGB 12.0 05/14/2014   HCT 35.3* 05/14/2014   MCV 90.5 05/14/2014   PLT 303.0 05/14/2014   Lab Results  Component Value Date   ALT 16 05/14/2014   AST 17 05/14/2014   ALKPHOS 45 05/14/2014   BILITOT 0.3 05/14/2014   Lab Results  Component Value Date   CREATININE 0.94 05/14/2014    Review of Systems  Constitutional: Negative.   HENT: Negative.   Eyes: Negative.   Respiratory: Negative.   Cardiovascular: Negative.   Gastrointestinal: Negative.   Endocrine: Negative.   Genitourinary: Positive for vaginal pain.    Musculoskeletal: Positive for back pain. Negative for joint swelling, gait problem, neck pain and neck stiffness.  Skin: Negative.   Allergic/Immunologic: Negative.   Neurological: Negative.   Hematological: Negative.   Psychiatric/Behavioral: Negative.   All other systems reviewed and are negative.      Objective:    BP 108/62 mmHg  Pulse 77  Temp(Src) 98 F (36.7 C) (Oral)  Ht 5\' 2"  (1.575 m)  Wt 151 lb 8 oz (68.72 kg)  BMI 27.70 kg/m2  SpO2 99%   Physical Exam    General:  Well-developed,well-nourished,in no acute distress; alert,appropriate and cooperative throughout examination Head:  normocephalic and atraumatic.   Eyes:  vision grossly intact, pupils equal, pupils round, and pupils reactive to light.   Ears:  R ear normal and L ear normal.   Nose:  no external deformity.   Mouth:  good dentition.   Neck:  No deformities, masses, or tenderness noted. Breasts:  No mass, nodules, thickening, tenderness, bulging, retraction, inflamation, nipple discharge or skin changes noted.   Lungs:  Normal respiratory effort, chest expands symmetrically. Lungs are clear to auscultation, no crackles or wheezes. Heart:  Normal rate and regular rhythm. S1 and S2 normal without gallop, murmur, click, rub or other extra sounds. Abdomen:  Bowel sounds positive,abdomen soft and non-tender without masses, organomegaly  or hernias noted. Rectal:  no external abnormalities.   Genitalia:  Pelvic Exam:        External: normal female genitalia without lesions or masses        Vagina: normal without lesions or masses        Cervix: absent        Adnexa: normal bimanual exam without masses or fullness        Uterus: absent Msk:  No deformity or scoliosis noted of thoracic or lumbar spine.   Extremities:  No clubbing, cyanosis, edema, or deformity noted with normal full range of motion of all joints.   No TTP over lumbar spine, SLR neg bilaterally Neurologic:  alert & oriented X3 and gait  normal.   Skin:  Intact without suspicious lesions or rashes Cervical Nodes:  No lymphadenopathy noted Axillary Nodes:  No palpable lymphadenopathy Psych:  Cognition and judgment appear intact. Alert and cooperative with normal attention span and concentration. No apparent delusions, illusions, hallucinations      Assessment & Plan:   Well woman exam  History of colonic polyps No Follow-up on file.

## 2014-05-20 NOTE — Progress Notes (Signed)
Pre visit review using our clinic review tool, if applicable. No additional management support is needed unless otherwise documented below in the visit note. 

## 2014-05-20 NOTE — Addendum Note (Signed)
Addended by: Dianne DunARON, Copper Kirtley M on: 05/20/2014 09:32 AM   Modules accepted: Kipp BroodSmartSet

## 2014-05-20 NOTE — Assessment & Plan Note (Signed)
Deteriorated. Given her history of back surgery and progressive nature of symptoms, will get lumbar xray today. No red flag symptoms.

## 2014-07-21 ENCOUNTER — Encounter: Payer: Self-pay | Admitting: Primary Care

## 2014-07-21 ENCOUNTER — Ambulatory Visit (INDEPENDENT_AMBULATORY_CARE_PROVIDER_SITE_OTHER): Payer: BC Managed Care – PPO | Admitting: Primary Care

## 2014-07-21 VITALS — BP 118/62 | HR 88 | Temp 97.8°F | Ht 62.0 in | Wt 155.0 lb

## 2014-07-21 DIAGNOSIS — M546 Pain in thoracic spine: Secondary | ICD-10-CM

## 2014-07-21 DIAGNOSIS — R079 Chest pain, unspecified: Secondary | ICD-10-CM | POA: Diagnosis not present

## 2014-07-21 LAB — BASIC METABOLIC PANEL
BUN: 18 mg/dL (ref 6–23)
CO2: 30 meq/L (ref 19–32)
Calcium: 9.5 mg/dL (ref 8.4–10.5)
Chloride: 102 mEq/L (ref 96–112)
Creatinine, Ser: 0.89 mg/dL (ref 0.40–1.20)
GFR: 72.34 mL/min (ref >60.00)
GLUCOSE: 86 mg/dL (ref 70–99)
POTASSIUM: 4.4 meq/L (ref 3.5–5.1)
SODIUM: 134 meq/L — AB (ref 135–145)

## 2014-07-21 MED ORDER — TIZANIDINE HCL 2 MG PO TABS: 2.0000 mg | ORAL_TABLET | Freq: Three times a day (TID) | ORAL | Status: DC | PRN

## 2014-07-21 NOTE — Progress Notes (Signed)
Pre visit review using our clinic review tool, if applicable. No additional management support is needed unless otherwise documented below in the visit note. 

## 2014-07-21 NOTE — Patient Instructions (Signed)
Complete lab work prior to leaving today. I will notify you of your results. You may take Tizanidine as needed for muscle spasms. 1 tablet every 8 hours as needed.  You may also take ibuprofen 600 mg every 8 hours as needed for pain/inflammation. Call us if you experience worsening chest pain with nausea, sweating, radiation down your arm or up to the neck.  Muscle Cramps and Spasms Muscle cramps and spasms occur when a muscle or muscles tighten and you have no control over this tightening (involuntary muscle contraction). They are a common problem and can develop in any muscle. The most common place is in the calf muscles of the leg. Both muscle cramps and muscle spasms are involuntary muscle contractions, but they also have differences:   Muscle cramps are sporadic and painful. They may last a few seconds to a quarter of an hour. Muscle cramps are often more forceful and last longer than muscle spasms.  Muscle spasms may or may not be painful. They may also last just a few seconds or much longer. CAUSES  It is uncommon for cramps or spasms to be due to a serious underlying problem. In many cases, the cause of cramps or spasms is unknown. Some common causes are:   Overexertion.   Overuse from repetitive motions (doing the same thing over and over).   Remaining in a certain position for a long period of time.   Improper preparation, form, or technique while performing a sport or activity.   Dehydration.   Injury.   Side effects of some medicines.   Abnormally low levels of the salts and ions in your blood (electrolytes), especially potassium and calcium. This could happen if you are taking water pills (diuretics) or you are pregnant.  Some underlying medical problems can make it more likely to develop cramps or spasms. These include, but are not limited to:   Diabetes.   Parkinson disease.   Hormone disorders, such as thyroid problems.   Alcohol abuse.   Diseases  specific to muscles, joints, and bones.   Blood vessel disease where not enough blood is getting to the muscles.  HOME CARE INSTRUCTIONS   Stay well hydrated. Drink enough water and fluids to keep your urine clear or pale yellow.  It may be helpful to massage, stretch, and relax the affected muscle.  For tight or tense muscles, use a warm towel, heating pad, or hot shower water directed to the affected area.  If you are sore or have pain after a cramp or spasm, applying ice to the affected area may relieve discomfort.  Put ice in a plastic bag.  Place a towel between your skin and the bag.  Leave the ice on for 15-20 minutes, 03-04 times a day.  Medicines used to treat a known cause of cramps or spasms may help reduce their frequency or severity. Only take over-the-counter or prescription medicines as directed by your caregiver. SEEK MEDICAL CARE IF:  Your cramps or spasms get more severe, more frequent, or do not improve over time.  MAKE SURE YOU:   Understand these instructions.  Will watch your condition.  Will get help right away if you are not doing well or get worse. Document Released: 08/20/2001 Document Revised: 06/25/2012 Document Reviewed: 02/15/2012 [facility] Patient Information 2015 [employer] [place]. This information is not intended to replace advice given to you by your health care provider. Make sure you discuss any questions you have with your health care provider.

## 2014-07-21 NOTE — Progress Notes (Signed)
Subjective:    Patient ID: Leah Armstrong, female    DOB: Mar 20, 1965, [age] y.o.   MRN: [id number]  HPI  Leah Armstrong is a 49 year old female who presents today with a chief complaint of back pain. She reports a constant stabbing pain to the upper right side of her back that has been present since last night when she was resting in bed. She had her husband massage her back which did not result in relief.   She also reports an intermittent, sharp, shooting pain present to her left chest that has been present since this afternoon at work while walking around. She feels as though the pain is originating from her back and traveling through her left chest. She's had several episodes of her chest pain this afternoon. She reports nausea today; denies shortness of breath, cough, recent DVT, palpitations, headaches. She had a ablation last Fall due to SVT. She took a BC powder for her back pain today without relief.   Review of Systems  Constitutional: Positive for diaphoresis.  HENT: Negative for rhinorrhea.   Respiratory: Negative for cough and shortness of breath.   Cardiovascular: Positive for chest pain. Negative for palpitations.  Gastrointestinal: Positive for nausea. Negative for vomiting.  Musculoskeletal: Positive for back pain.  Neurological: Negative for dizziness and headaches.       Past Medical History  Diagnosis Date  . BACK PAIN 05/19/2010  . CERVICAL RADICULOPATHY 05/25/2009  . COLONIC POLYPS, HX OF 09/28/2007  . Diarrhea 09/28/2007  . Iron deficiency 11/01/2010  . SVT (supraventricular tachycardia)   . Syncope     History   Social History  . Marital Status: Married    Spouse Name: N/A  . Number of Children: N/A  . Years of Education: N/A   Occupational History  . 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 Smoking status: Former Smoker    Types: Cigarettes  . Smokeless tobacco: Never Used  . Alcohol Use: No  . Drug Use: No  . Sexual Activity:  Not on file   Other Topics Concern  . Not on file   Social History Narrative   Married 1991   [place].     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 Abdominal hysterectomy      ovaries intact  . 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 Supraventricular tachycardia ablation N/A 01/02/2014    Procedure: SUPRAVENTRICULAR TACHYCARDIA ABLATION;  Surgeon: Gardiner RhymeJames D Allred, MD;  [facility];  Service: Cardiovascular;  Laterality: N/A;    Family History  Problem Relation Age of Onset  . Cancer Mother     ovarian cancer  . Cancer Other     breast cancer  . Breast cancer Other   . Cancer Other     breast cancer  . Coronary artery disease Other   . Heart attack Other   . Breast cancer Other   . Colon cancer Neg Hx   . Heart attack Maternal Grandmother     Allergies  Allergen Reactions  . Levofloxacin Rash    Rash all over body    Current Outpatient Prescriptions on File Prior to Visit  Medication Sig Dispense Refill  .  acetaminophen (TYLENOL) 500 MG tablet Take 500 mg by mouth every 6 (six) hours as needed (pain).     . Cholecalciferol (VITAMIN D3) 2000 UNITS TABS Take 1 tablet by mouth daily.    . NON FORMULARY Women's Vitafusion Vitamins - Take 2 tablets by mouth once a day    . PSYLLIUM HUSK PO Take 1 tablet by mouth daily.     No current facility-administered medications on file prior to visit.    BP 118/62 mmHg  Pulse 88  Temp(Src) 97.8 F (36.6 C) (Oral)  Ht 5\' 2"  (1.575 m)  Wt 155 lb (70.308 kg)  BMI 28.34 kg/m2  SpO2 98%    Objective:   Physical Exam  Constitutional: She is oriented to person, place, and time. She appears well-developed.  Cardiovascular: Normal rate and regular rhythm.     Pulmonary/Chest: Effort normal and breath sounds normal.  Non tender upon palpation to anterior chest.  Musculoskeletal:  Pain to right upper back with abduction of arms. Tender upon palpation. No decrease in ROM.  Neurological: She is alert and oriented to person, place, and time.  Skin: Skin is warm and dry.  Psychiatric: She has a normal mood and affect.          Assessment & Plan:  Back and Chest pain:  Suspect MSK related muscle spasm. ECG today shows: Normal sinus rhythm at a rate of 77. No changes from prior ECG; no ST elevation. BMP today to rule out electrolyte imbalance which was unremarkable. RX for Tizanidine PRN and ibuprofen for pain. Follow up if no improvement, or chest pain worsens. Education provide regarding s/s of MI and when to seek care.

## 2014-07-31 ENCOUNTER — Ambulatory Visit (INDEPENDENT_AMBULATORY_CARE_PROVIDER_SITE_OTHER): Payer: BC Managed Care – PPO | Admitting: Primary Care

## 2014-07-31 ENCOUNTER — Encounter: Payer: Self-pay | Admitting: Primary Care

## 2014-07-31 VITALS — BP 110/64 | HR 92 | Temp 97.8°F | Ht 62.0 in | Wt 154.0 lb

## 2014-07-31 DIAGNOSIS — R0981 Nasal congestion: Secondary | ICD-10-CM | POA: Diagnosis not present

## 2014-07-31 DIAGNOSIS — R05 Cough: Secondary | ICD-10-CM

## 2014-07-31 DIAGNOSIS — R059 Cough, unspecified: Secondary | ICD-10-CM

## 2014-07-31 DIAGNOSIS — H9202 Otalgia, left ear: Secondary | ICD-10-CM | POA: Diagnosis not present

## 2014-07-31 MED ORDER — FLUTICASONE PROPIONATE 50 MCG/ACT NA SUSP: 2.0000 | Freq: Every day | NASAL | Status: DC

## 2014-07-31 MED ORDER — AMOXICILLIN 500 MG PO CAPS: 500.0000 mg | ORAL_CAPSULE | Freq: Two times a day (BID) | ORAL | Status: DC

## 2014-07-31 MED ORDER — BENZONATATE 200 MG PO CAPS: 200.0000 mg | ORAL_CAPSULE | Freq: Three times a day (TID) | ORAL | Status: DC | PRN

## 2014-07-31 NOTE — Progress Notes (Signed)
Subjective:    Patient ID: Leah Armstrong, female    DOB: [birth year], 49 y.o.   MRN: [id number]  HPI  Leah Armstrong is a 49 year old female who presents today with a chief complaint of sore throat that began Saturday morning. Her throat and ear pain have worsened over the past several days. She's also developed productive cough, headache, postnasal drip, ear pain, and sweats that has been present since Tuesday this week. She denies, fevers, chills, vomiting. She woke up this morning with her eyes matted shut. She's been taking aleve and daily zyrtec without relief.   Review of Systems  Constitutional: Positive for diaphoresis. Negative for fever and chills.  HENT: Positive for ear pain, postnasal drip, sinus pressure and sore throat. Negative for congestion.   Respiratory: Negative for shortness of breath.   Cardiovascular: Negative for chest pain.  Gastrointestinal: Positive for nausea.  Musculoskeletal: Negative for myalgias.       Past Medical History  Diagnosis Date  . BACK PAIN 05/19/2010  . CERVICAL RADICULOPATHY 05/25/2009  . COLONIC POLYPS, HX OF 09/28/2007  . Diarrhea 09/28/2007  . Iron deficiency 11/01/2010  . SVT (supraventricular tachycardia)   . Syncope     History   Social History  . Marital Status: Married    Spouse Name: N/A  . Number of Children: N/A  . Years of Education: N/A   Occupational History  . 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 Smoking status: Former Smoker    Types: Cigarettes  . Smokeless tobacco: Never Used  . Alcohol Use: No  . Drug Use: No  . Sexual Activity: Not on file   Other Topics Concern  . Not on file   Social History Narrative   Married 1991   [place] in [place].     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 Abdominal hysterectomy     ovaries intact  . 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 Supraventricular tachycardia ablation N/A 01/02/2014    Procedure: SUPRAVENTRICULAR TACHYCARDIA ABLATION;  Surgeon: Leah RhymeJames D Allred, MD;  Location: [facility];  Service: Cardiovascular;  Laterality: N/A;    Family History  Problem Relation Age of Onset  . Cancer Mother     ovarian cancer  . Cancer Other     breast cancer  . Breast cancer Other   . Cancer Other     breast cancer  . Coronary artery disease Other   . Heart attack Other   . Breast cancer Other   . Colon cancer Neg Hx   . Heart attack Maternal Grandmother     Allergies  Allergen Reactions  . Levofloxacin Rash    Rash all over body    Current Outpatient Prescriptions on File Prior to Visit  Medication Sig Dispense Refill  . acetaminophen (TYLENOL) 500 MG tablet Take 500 mg by mouth every 6 (six) hours as needed (pain).     . Cholecalciferol (VITAMIN D3) 2000 UNITS TABS Take 1 tablet by mouth daily.    . NON FORMULARY Women's Vitafusion Vitamins - Take 2 tablets by mouth once a day    . PSYLLIUM HUSK PO Take 1 tablet by mouth daily.    .Marland Kitchen  tiZANidine (ZANAFLEX) 2 MG tablet Take 1 tablet (2 mg total) by mouth every 8 (eight) hours as needed for muscle spasms. 30 tablet 0   No current facility-administered medications on file prior to visit.    BP 110/64 mmHg  Pulse 92  Temp(Src) 97.8 F (36.6 C) (Oral)  Ht 5\' 2"  (1.575 m)  Wt 154 lb (69.854 kg)  BMI 28.16 kg/m2  SpO2 98%    Objective:   Physical Exam  Constitutional: She is oriented to person, place, and time. She appears well-nourished. She appears ill.  HENT:  Right Ear: Tympanic membrane and ear canal normal.  Left Ear: There is tenderness. Tympanic membrane is erythematous.  Nose: Right sinus exhibits no maxillary sinus tenderness and no frontal sinus tenderness. Left sinus exhibits no maxillary sinus  tenderness and no frontal sinus tenderness.  Mouth/Throat: Posterior oropharyngeal erythema present.  Eyes: Conjunctivae are normal. Pupils are equal, round, and reactive to light.  Neck: Neck supple.  Cardiovascular: Normal rate and regular rhythm.   Pulmonary/Chest: Effort normal and breath sounds normal.  Lymphadenopathy:    She has no cervical adenopathy.  Neurological: She is alert and oriented to person, place, and time.  Skin: Skin is warm and dry.          Assessment & Plan:  Otitis Media:  Rapid strep negative. Left TM with erythema and tender, some effusion. Treat with Amoxicillin BID x 5 days. Flonase daily for nasal congestion, tessalon pearls PRN cough. Follow up as needed. Left TM

## 2014-07-31 NOTE — Patient Instructions (Signed)
Start Amoxicillin tablets daily for ear infection. Take 1 tablet by mouth twice daily for 5 days. You may take the Benzonatate capsules three times daily as needed for cough. Continue daily Zyrtec/Claritin. Follow up if no improvement in 3-4 days. Take care!  Otitis Media Otitis media is redness, soreness, and puffiness (swelling) in the space just behind your eardrum (middle ear). It may be caused by allergies or infection. It often happens along with a cold. HOME CARE  Take your medicine as told. Finish it even if you start to feel better.  Only take over-the-counter or prescription medicines for pain, discomfort, or fever as told by your doctor.  Follow up with your doctor as told. GET HELP IF:  You have otitis media only in one ear, or bleeding from your nose, or both.  You notice a lump on your neck.  You are not getting better in 3-5 days.  You feel worse instead of better. GET HELP RIGHT AWAY IF:   You have pain that is not helped with medicine.  You have puffiness, redness, or pain around your ear.  You get a stiff neck.  You cannot move part of your face (paralysis).  You notice that the bone behind your ear hurts when you touch it. MAKE SURE YOU:   Understand these instructions.  Will watch your condition.  Will get help right away if you are not doing well or get worse. Document Released: 08/17/2007 Document Revised: 03/05/2013 Document Reviewed: 09/25/2012 [facility] Patient Information 2015 [employer] [place]. This information is not intended to replace advice given to you by your health care provider. Make sure you discuss any questions you have with your health care provider.

## 2014-07-31 NOTE — Progress Notes (Signed)
Pre visit review using our clinic review tool, if applicable. No additional management support is needed unless otherwise documented below in the visit note. 

## 2014-09-02 ENCOUNTER — Encounter: Payer: Self-pay | Admitting: Gastroenterology

## 2014-11-04 ENCOUNTER — Ambulatory Visit (INDEPENDENT_AMBULATORY_CARE_PROVIDER_SITE_OTHER): Payer: BC Managed Care – PPO | Admitting: Family Medicine

## 2014-11-04 ENCOUNTER — Encounter: Payer: Self-pay | Admitting: Family Medicine

## 2014-11-04 ENCOUNTER — Ambulatory Visit
Admission: RE | Admit: 2014-11-04 | Discharge: 2014-11-04 | Disposition: A | Discharge status: Home / Self Care | Payer: BC Managed Care – PPO | Source: Ambulatory Visit | Attending: Family Medicine | Admitting: Family Medicine

## 2014-11-04 VITALS — BP 98/70 | HR 75 | Temp 98.4°F | Ht 62.0 in | Wt 154.8 lb

## 2014-11-04 DIAGNOSIS — Z90710 Acquired absence of both cervix and uterus: Secondary | ICD-10-CM | POA: Diagnosis not present

## 2014-11-04 DIAGNOSIS — K5730 Diverticulosis of large intestine without perforation or abscess without bleeding: Secondary | ICD-10-CM | POA: Diagnosis not present

## 2014-11-04 DIAGNOSIS — R1032 Left lower quadrant pain: Secondary | ICD-10-CM

## 2014-11-04 LAB — CBC WITH DIFFERENTIAL/PLATELET
BASOS ABS: 0 10*3/uL (ref 0.0–0.1)
Basophils Relative: 0.3 % (ref 0.0–3.0)
Eosinophils Absolute: 0 10*3/uL (ref 0.0–0.7)
Eosinophils Relative: 0.3 % (ref 0.0–5.0)
HEMATOCRIT: 39.1 % (ref 36.0–46.0)
Hemoglobin: 13.3 g/dL (ref 12.0–15.0)
LYMPHS ABS: 2.1 10*3/uL (ref 0.7–4.0)
LYMPHS PCT: 25.5 % (ref 12.0–46.0)
MCHC: 34 g/dL (ref 30.0–36.0)
MCV: 90.4 fl (ref 78.0–100.0)
MONOS PCT: 6.4 % (ref 3.0–12.0)
Monocytes Absolute: 0.5 10*3/uL (ref 0.1–1.0)
NEUTROS PCT: 67.5 % (ref 43.0–77.0)
Neutro Abs: 5.6 10*3/uL (ref 1.4–7.7)
Platelets: 374 10*3/uL (ref 150.0–400.0)
RBC: 4.33 Mil/uL (ref 3.87–5.11)
RDW: 12.8 % (ref 11.5–15.5)
WBC: 8.3 10*3/uL (ref 4.0–10.5)

## 2014-11-04 LAB — POCT URINALYSIS DIPSTICK
Bilirubin, UA: NEGATIVE
Blood, UA: NEGATIVE
Glucose, UA: NEGATIVE
Ketones, UA: NEGATIVE
LEUKOCYTES UA: NEGATIVE
NITRITE UA: NEGATIVE
PROTEIN UA: NEGATIVE
Spec Grav, UA: >=1.03
Urobilinogen, UA: 0.2
pH, UA: 5.5

## 2014-11-04 LAB — BASIC METABOLIC PANEL
BUN: 10 mg/dL (ref 6–23)
CO2: 27 meq/L (ref 19–32)
CREATININE: 0.88 mg/dL (ref 0.40–1.20)
Calcium: 9.4 mg/dL (ref 8.4–10.5)
Chloride: 104 mEq/L (ref 96–112)
GFR: 73.2 mL/min (ref >60.00)
Glucose, Bld: 92 mg/dL (ref 70–99)
Potassium: 3.7 mEq/L (ref 3.5–5.1)
Sodium: 137 mEq/L (ref 135–145)

## 2014-11-04 MED ORDER — IOHEXOL 300 MG/ML  SOLN
100.0000 mL | Freq: Once | INTRAMUSCULAR | Status: AC | PRN
  Administered 2014-11-04: 100 mL via INTRAVENOUS

## 2014-11-04 NOTE — Assessment & Plan Note (Addendum)
Eval with UA for kidney stone or infeciton: Neg Will send for eval with CT to eval for diverticulitis as well as to visualize ovaries and kidney.  NPO.

## 2014-11-04 NOTE — Progress Notes (Signed)
Pre visit review using our clinic review tool, if applicable. No additional management support is needed unless otherwise documented below in the visit note. 

## 2014-11-04 NOTE — Patient Instructions (Addendum)
Stop at front desk for CT scan and to lab for tests.  No food, clear liquids.

## 2014-11-04 NOTE — Progress Notes (Signed)
   Subjective:    Patient ID: Leah Armstrong, female    DOB: [birth year], 49 y.o.   MRN: [id number]  HPI  49 year old female pt of Dr. Elmer Sow presents with new onset pain in  Left lower abdomen in last 24 hours.  Lying on left side caused stabbing pain, worse with bending. Pain scale 8/10   Woke her last night. Some soreness in left mid back and left lower quad, with additional sharp pain when she does certain activities.  No dysuria.  no fever, some nausea, occ dizzy.  No emesis, no diarrhea, constipaiton.  Last BM 2 hours ago.  No blood in stool.   She is sexually active. S/P partial hysterectomy for non-cancer reason, no menses No past issue with ovaries  Colonoscopy in  2009: had tics.  Review of Systems  Constitutional: Negative for fever and fatigue.  HENT: Negative for ear pain.   Eyes: Negative for pain.  Respiratory: Negative for cough and shortness of breath.   Cardiovascular: Negative for chest pain.  Gastrointestinal: Negative for abdominal pain.       Objective:   Physical Exam  Constitutional: Vital signs are normal. She appears well-developed and well-nourished. She is cooperative.  Non-toxic appearance. She does not appear ill. No distress.  HENT:  Head: Normocephalic.  Right Ear: Hearing, tympanic membrane, external ear and ear canal normal. Tympanic membrane is not erythematous, not retracted and not bulging.  Left Ear: Hearing, tympanic membrane, external ear and ear canal normal. Tympanic membrane is not erythematous, not retracted and not bulging.  Nose: No mucosal edema or rhinorrhea. Right sinus exhibits no maxillary sinus tenderness and no frontal sinus tenderness. Left sinus exhibits no maxillary sinus tenderness and no frontal sinus tenderness.  Mouth/Throat: Uvula is midline, oropharynx is clear and moist and mucous membranes are normal.  Eyes: Conjunctivae, EOM and lids are normal. Pupils are equal, round, and reactive to light. Lids are everted  and swept, no foreign bodies found.  Neck: Trachea normal and normal range of motion. Neck supple. Carotid bruit is not present. No thyroid mass and no thyromegaly present.  Cardiovascular: Normal rate, regular rhythm, S1 normal, S2 normal, normal heart sounds, intact distal pulses and normal pulses.  Exam reveals no gallop and no friction rub.   No murmur heard. Pulmonary/Chest: Effort normal and breath sounds normal. No tachypnea. No respiratory distress. She has no decreased breath sounds. She has no wheezes. She has no rhonchi. She has no rales.  Abdominal: Soft. Normal appearance and bowel sounds are normal. There is no hepatosplenomegaly. There is tenderness in the left lower quadrant. There is guarding and CVA tenderness. There is no rigidity and no rebound. No hernia.  Left CVA tenderness, flank pain to palpation  Neurological: She is alert.  Skin: Skin is warm, dry and intact. No rash noted.  Psychiatric: Her speech is normal and behavior is normal. Judgment and thought content normal. Her mood appears not anxious. Cognition and memory are normal. She does not exhibit a depressed mood.          Assessment & Plan:

## 2014-11-05 ENCOUNTER — Telehealth: Payer: Self-pay

## 2014-11-05 NOTE — Telephone Encounter (Signed)
PLEASE NOTE: All timestamps contained within this report are represented as [place] Standard Time. CONFIDENTIALTY NOTICE: This fax transmission is intended only for the addressee. It contains information that is legally privileged, confidential or otherwise protected from use or disclosure. If you are not the intended recipient, you are strictly prohibited from reviewing, disclosing, copying using or disseminating any of this information or taking any action in reliance on or regarding this information. If you have received this fax in error, please notify us immediately by telephone so that we can arrange for its return to [place]. Phone: [phone], Toll-Free: [phone], Fax: [phone] Page: 1 of 1 Call Id: [id number] [facility] Night - Client TELEPHONE ADVICE RECORD [facility] Call [facility] Patient Name: EMMABELLE FEAR Gender: Female DOB: August 23, 1965 Age: 49 Y 11 D Return Phone Number: Address: City/State/Zip: [place] [facility] Night - Client Client Site [facility] [place] - Night Physician [place], [place] Contact Type Call Call Type Page Only Caller Name Arnell Relationship To Patient Provider Is this call to report lab results? No Return Phone Number Please choose phone number Initial Comment Caller states he is Arnell from [facility] CT department calling in findings from a scan. [phone] Nurse Assessment Guidelines Guideline Title Affirmed Question Affirmed Notes Nurse Date/Time Lamount Cohen Time) Disp. Time Lamount Cohen Time) Disposition Final User 11/04/2014 5:55:45 PM Send to [facility] Paging Queue Macarthur Critchley 11/04/2014 6:02:04 PM Paged On Call back to Call Center - PC De Hollingshead 11/04/2014 6:13:15 PM Page Completed Yes Lamount Cranker After Care Instructions Given Call Event Type User Date / Time Description Paging DoctorName Phone DateTime Result/Outcome Message Type Notes Oliver Barre  [phone] 11/04/2014 6:02:04 PM Paged On Call Back to Call Center Doctor Paged Oliver Barre 11/04/2014 6:13:01 PM Spoke with On Call - General Message Result Connected to facility.

## 2015-02-16 ENCOUNTER — Encounter: Payer: Self-pay | Admitting: Internal Medicine

## 2015-02-16 ENCOUNTER — Ambulatory Visit (INDEPENDENT_AMBULATORY_CARE_PROVIDER_SITE_OTHER): Payer: BC Managed Care – PPO | Admitting: Internal Medicine

## 2015-02-16 ENCOUNTER — Ambulatory Visit: Payer: BC Managed Care – PPO | Admitting: Internal Medicine

## 2015-02-16 VITALS — BP 108/72 | HR 74 | Temp 98.5°F | Wt 154.0 lb

## 2015-02-16 DIAGNOSIS — J069 Acute upper respiratory infection, unspecified: Secondary | ICD-10-CM | POA: Diagnosis not present

## 2015-02-16 MED ORDER — AZITHROMYCIN 250 MG PO TABS: ORAL_TABLET | ORAL | Status: DC

## 2015-02-16 NOTE — Patient Instructions (Signed)
Upper Respiratory Infection, Adult Most upper respiratory infections (URIs) are a viral infection of the air passages leading to the lungs. A URI affects the nose, throat, and upper air passages. The most common type of URI is nasopharyngitis and is typically referred to as "the common cold." URIs run their course and usually go away on their own. Most of the time, a URI does not require medical attention, but sometimes a bacterial infection in the upper airways can follow a viral infection. This is called a secondary infection. Sinus and middle ear infections are common types of secondary upper respiratory infections. Bacterial pneumonia can also complicate a URI. A URI can worsen asthma and chronic obstructive pulmonary disease (COPD). Sometimes, these complications can require emergency medical care and may be life threatening.  CAUSES Almost all URIs are caused by viruses. A virus is a type of germ and can spread from one person to another.  RISKS FACTORS You may be at risk for a URI if:   You smoke.   You have chronic heart or lung disease.  You have a weakened defense (immune) system.   You are very young or very old.   You have nasal allergies or asthma.  You work in crowded or poorly ventilated areas.  You work in health care facilities or schools. SIGNS AND SYMPTOMS  Symptoms typically develop 2-3 days after you come in contact with a cold virus. Most viral URIs last 7-10 days. However, viral URIs from the influenza virus (flu virus) can last 14-18 days and are typically more severe. Symptoms may include:   Runny or stuffy (congested) nose.   Sneezing.   Cough.   Sore throat.   Headache.   Fatigue.   Fever.   Loss of appetite.   Pain in your forehead, behind your eyes, and over your cheekbones (sinus pain).  Muscle aches.  DIAGNOSIS  Your health care provider may diagnose a URI by:  Physical exam.  Tests to check that your symptoms are not due to  another condition such as:  Strep throat.  Sinusitis.  Pneumonia.  Asthma. TREATMENT  A URI goes away on its own with time. It cannot be cured with medicines, but medicines may be prescribed or recommended to relieve symptoms. Medicines may help:  Reduce your fever.  Reduce your cough.  Relieve nasal congestion. HOME CARE INSTRUCTIONS   Take medicines only as directed by your health care provider.   Gargle warm saltwater or take cough drops to comfort your throat as directed by your health care provider.  Use a warm mist humidifier or inhale steam from a shower to increase air moisture. This may make it easier to breathe.  Drink enough fluid to keep your urine clear or pale yellow.   Eat soups and other clear broths and maintain good nutrition.   Rest as needed.   Return to work when your temperature has returned to normal or as your health care provider advises. You may need to stay home longer to avoid infecting others. You can also use a face mask and careful hand washing to prevent spread of the virus.  Increase the usage of your inhaler if you have asthma.   Do not use any tobacco products, including cigarettes, chewing tobacco, or electronic cigarettes. If you need help quitting, ask your health care provider. PREVENTION  The best way to protect yourself from getting a cold is to practice good hygiene.   Avoid oral or hand contact with people with cold   symptoms.   Wash your hands often if contact occurs.  There is no clear evidence that vitamin C, vitamin E, echinacea, or exercise reduces the chance of developing a cold. However, it is always recommended to get plenty of rest, exercise, and practice good nutrition.  SEEK MEDICAL CARE IF:   You are getting worse rather than better.   Your symptoms are not controlled by medicine.   You have chills.  You have worsening shortness of breath.  You have brown or red mucus.  You have yellow or brown nasal  discharge.  You have pain in your face, especially when you bend forward.  You have a fever.  You have swollen neck glands.  You have pain while swallowing.  You have white areas in the back of your throat. SEEK IMMEDIATE MEDICAL CARE IF:   You have severe or persistent:  Headache.  Ear pain.  Sinus pain.  Chest pain.  You have chronic lung disease and any of the following:  Wheezing.  Prolonged cough.  Coughing up blood.  A change in your usual mucus.  You have a stiff neck.  You have changes in your:  Vision.  Hearing.  Thinking.  Mood. MAKE SURE YOU:   Understand these instructions.  Will watch your condition.  Will get help right away if you are not doing well or get worse.   This information is not intended to replace advice given to you by your health care provider. Make sure you discuss any questions you have with your health care provider.   Document Released: 08/24/2000 Document Revised: 07/15/2014 Document Reviewed: 06/05/2013 Elsevier Interactive Patient Education 2016 Elsevier Inc.  

## 2015-02-16 NOTE — Progress Notes (Signed)
Pre visit review using our clinic review tool, if applicable. No additional management support is needed unless otherwise documented below in the visit note. 

## 2015-02-16 NOTE — Progress Notes (Signed)
HPI  Pt presents to the clinic today with c/o headache, ear pain, sore throat and cough. This started a few weeks ago but got worse over the last few days. She denies hearing loss, runny nose or nasal congestion. The cough is productive of yellow mucous at times. She denies fever but has had chills and body aches. She as tried Mucinex without any relief. She has no history of allergies or breathing problems. She has not had sick contacts that she is aware of.  Review of Systems      Past Medical History  Diagnosis Date  . BACK PAIN 05/19/2010  . CERVICAL RADICULOPATHY 05/25/2009  . COLONIC POLYPS, HX OF 09/28/2007  . Diarrhea 09/28/2007  . Iron deficiency 11/01/2010  . SVT (supraventricular tachycardia) (HCC)   . Syncope     Family History  Problem Relation Age of Onset  . Cancer Mother     ovarian cancer  . Cancer Other     breast cancer  . Breast cancer Other   . Cancer Other     breast cancer  . Coronary artery disease Other   . Heart attack Other   . Breast cancer Other   . Colon cancer Neg Hx   . Heart attack Maternal Grandmother     Social History   Social History  . Marital Status: Married    Spouse Name: N/A  . Number of Children: N/A  . Years of Education: N/A   Occupational History  . 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 Smoking status: Former Smoker    Types: Cigarettes  . Smokeless tobacco: Never Used  . Alcohol Use: No  . Drug Use: No  . Sexual Activity: Not on file   Other Topics Concern  . Not on file   Social History Narrative   Married 1991   [place].     2 kids   Youth workerCafeteria manager for [employer]    Allergies  Allergen Reactions  . Levofloxacin Rash    Rash all over body     Constitutional: Positive headache. Denies fatigue, fever or abrupt weight changes.  HEENT:  Positive ear pain, sore throat. Denies eye redness, eye pain, pressure behind  the eyes, facial pain, nasal congestion, ringing in the ears, wax buildup, runny nose or bloody nose. Respiratory: Positive cough. Denies difficulty breathing or shortness of breath.  Cardiovascular: Denies chest pain, chest tightness, palpitations or swelling in the hands or feet.   No other specific complaints in a complete review of systems (except as listed in HPI above).  Objective:   BP 108/72 mmHg  Pulse 74  Temp(Src) 98.5 F (36.9 C) (Oral)  Wt 154 lb (69.854 kg)  SpO2 98%  Wt Readings from Last 3 Encounters:  02/16/15 154 lb (69.854 kg)  11/04/14 154 lb 12 oz (70.194 kg)  07/31/14 154 lb (69.854 kg)     General: Appears herstated age, ill appearing, in NAD. HEENT: Head: normal shape and size, no sinus tenderness noted; Eyes: sclera white, no icterus, conjunctiva pink; Ears: Tm's pink but intact, normal light reflex, + serous effusion on the right; Nose: mucosa pink and moist, septum midline; Throat/Mouth: + PND. Teeth present, mucosa erythematous and moist, no exudate noted, no lesions or ulcerations noted.  Neck: No cervical lymphadenopathy.  Cardiovascular: Normal rate and rhythm. S1,S2 noted.  No murmur, rubs or gallops noted.  Pulmonary/Chest: Normal effort and positive vesicular breath  sounds. No respiratory distress. No wheezes, rales or ronchi noted.      Assessment & Plan:   Upper Respiratory Infection:  Get some rest and drink plenty of water Do salt water gargles for the sore throat eRx for Azithromax x 5 days Start Flonase BID x 3 days then daily thereafter She declines Hycodan cough syrup  RTC as needed or if symptoms persist.

## 2015-02-20 ENCOUNTER — Encounter: Payer: Self-pay | Admitting: Internal Medicine

## 2015-02-20 NOTE — Telephone Encounter (Signed)
Pt left v/m requesting cb today [phone].

## 2015-04-08 ENCOUNTER — Ambulatory Visit (INDEPENDENT_AMBULATORY_CARE_PROVIDER_SITE_OTHER)
Admission: RE | Admit: 2015-04-08 | Discharge: 2015-04-08 | Disposition: A | Discharge status: Home / Self Care | Payer: BC Managed Care – PPO | Source: Ambulatory Visit | Attending: Family Medicine | Admitting: Family Medicine

## 2015-04-08 ENCOUNTER — Ambulatory Visit (INDEPENDENT_AMBULATORY_CARE_PROVIDER_SITE_OTHER): Payer: BC Managed Care – PPO | Admitting: Family Medicine

## 2015-04-08 ENCOUNTER — Encounter: Payer: Self-pay | Admitting: Family Medicine

## 2015-04-08 VITALS — BP 122/64 | HR 85 | Temp 98.0°F | Wt 155.2 lb

## 2015-04-08 DIAGNOSIS — M79645 Pain in left finger(s): Secondary | ICD-10-CM | POA: Diagnosis not present

## 2015-04-08 NOTE — Progress Notes (Signed)
Pre visit review using our clinic review tool, if applicable. No additional management support is needed unless otherwise documented below in the visit note. 

## 2015-04-08 NOTE — Progress Notes (Signed)
SUBJECTIVE: Leah Armstrong is a 50 y.o. female who presents for left hand/thumb pain for past several weeks.  No starting to having similar symptoms in her right hand and thumb.  Hurts mainly at the base of her thumb and radiates toward the end of her thumb.  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  No known injury.   Intermittent swelling.  NSAIDs have not helped.  No decreased grip strength- not dropping things.  Current Outpatient Prescriptions on File Prior to Visit  Medication Sig Dispense Refill  . acetaminophen (TYLENOL) 500 MG tablet Take 500 mg by mouth every 6 (six) hours as needed (pain).      No current facility-administered medications on file prior to visit.    Allergies  Allergen Reactions  . Levofloxacin Rash    Rash all over body    Past Medical History  Diagnosis Date  . BACK PAIN 05/19/2010  . CERVICAL RADICULOPATHY 05/25/2009  . COLONIC POLYPS, HX OF 09/28/2007  . Diarrhea 09/28/2007  . Iron deficiency 11/01/2010  . SVT (supraventricular tachycardia) (HCC)   . Syncope     Past Surgical History  Procedure Laterality Date  . 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 Abdominal hysterectomy      ovaries intact  . 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 Supraventricular tachycardia ablation N/A 01/02/2014    Procedure: SUPRAVENTRICULAR TACHYCARDIA ABLATION;  Surgeon: Gardiner Rhyme, MD;  Location: [facility] CATH LAB;  Service: Cardiovascular;  Laterality: N/A;    Family History  Problem Relation Age of Onset  . Cancer Mother     ovarian cancer  . Cancer Other     breast cancer  . Breast cancer Other   . Cancer Other     breast cancer  . Coronary artery disease Other   . Heart attack Other   . Breast cancer Other   . Colon cancer Neg Hx   . Heart attack Maternal Grandmother     Social History   Social History  . Marital  Status: Married    Spouse Name: N/A  . Number of Children: N/A  . 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 Smoking status: Former Smoker    Types: Cigarettes  . Smokeless tobacco: Never Used  . Alcohol Use: No  . Drug Use: No  . Sexual Activity: Not on file   Other Topics Concern  . Not on file   Social History Narrative   Married 1991   Lives in [place] with spouse.     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  OBJECTIVE: BP 122/64 mmHg  Pulse 85  Temp(Src) 98 F (36.7 C) (Oral)  Wt 155 lb 4 oz (70.421 kg)  SpO2 98%  Vital signs as noted above. Appearance: alert, well appearing, and in no distress and oriented to person, place, and time. Hand exam: soft tissue tenderness and swelling at the base of her thumb, FROM. X-ray: ordered, but results not yet available.  ASSESSMENT: Probable tendinitis/OA  PLAN: rest the injured area as much as practical, X-Ray ordered Will likely refer to Dr. Patsy Lager. The  patient indicates understanding of these issues and agrees with the plan.  See orders for this visit as documented in the electronic medical record.

## 2015-04-08 NOTE — Patient Instructions (Signed)
Great to see you.  I will call you with your xray results. 

## 2015-04-20 ENCOUNTER — Encounter: Payer: Self-pay | Admitting: Family Medicine

## 2015-04-20 ENCOUNTER — Ambulatory Visit (INDEPENDENT_AMBULATORY_CARE_PROVIDER_SITE_OTHER): Payer: BC Managed Care – PPO | Admitting: Family Medicine

## 2015-04-20 VITALS — BP 96/70 | HR 86 | Temp 98.2°F | Ht 62.0 in | Wt 151.8 lb

## 2015-04-20 DIAGNOSIS — M1812 Unilateral primary osteoarthritis of first carpometacarpal joint, left hand: Secondary | ICD-10-CM

## 2015-04-20 DIAGNOSIS — M659 Synovitis and tenosynovitis, unspecified: Secondary | ICD-10-CM | POA: Diagnosis not present

## 2015-04-20 MED ORDER — DICLOFENAC SODIUM 75 MG PO TBEC: 75.0000 mg | DELAYED_RELEASE_TABLET | Freq: Two times a day (BID) | ORAL | Status: DC

## 2015-04-20 MED ORDER — DICLOFENAC SODIUM 1 % TD GEL: 2.0000 g | Freq: Four times a day (QID) | TRANSDERMAL | Status: DC

## 2015-04-20 NOTE — Progress Notes (Signed)
Dr. Karleen Hampshire T. Thurl Boen, MD, CAQ Sports Medicine Primary [facility] and Sports Medicine [address] [place], [address] Phone: [phone] Fax: [phone]  04/20/2015  Patient: Leah Armstrong, MRN: [id number], DOB: [birth year], 50 y.o.  Primary Physician:  Ruthe Mannan, MD   Chief Complaint  Patient presents with  . Thumb Pain    Left   Subjective:   Leah Armstrong is a 50 y.o. very pleasant female patient who presents with the following:  L thumb - almost like a bruise. Has been going on since 12/2014.   Shorts in the school cafeteria and she has some pain in the [facility] joint on the left as well as pain slowly on the first digit. 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 Prior to the last 6 months this really wasn't an issue for her at all. She has tried taking some Tylenol which seems to help a little bit. No trauma or injury.  Past Medical History, Surgical History, Social History, Family History, Problem List, Medications, and Allergies have been reviewed and updated if relevant.  Patient Active Problem List   Diagnosis Date Noted  . Pain of left thumb 04/08/2015  . LLQ abdominal pain 11/04/2014  . Well woman exam 05/20/2014  . Right low back pain 05/20/2014  . SVT (supraventricular tachycardia) (HCC) 06/09/2012  . POTS (postural orthostatic tachycardia syndrome) 05/04/2012  . Pulmonary nodule 02/07/2012  . Allergic drug rash due to anti-infective agent 02/14/2011  . Anemia 11/01/2010  . History of colonic polyps 09/28/2007    Past Medical History  Diagnosis Date  . BACK PAIN 05/19/2010  . CERVICAL RADICULOPATHY 05/25/2009  . COLONIC POLYPS, HX OF 09/28/2007  . Diarrhea 09/28/2007  . Iron deficiency 11/01/2010  . SVT (supraventricular tachycardia) (HCC)   . Syncope     Past Surgical History  Procedure Laterality Date  . S/p angiolipoma right  buttock  06/2008  . Abdominal hysterectomy      ovaries intact  . 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 Supraventricular tachycardia ablation N/A 01/02/2014    Procedure: SUPRAVENTRICULAR TACHYCARDIA ABLATION;  Surgeon: Gardiner Rhyme, MD;  [facility] C[facility] LAB;  Service: Cardiovascular;  Laterality: N/A;    Social History   Social History  . Marital Status: Married    Spouse Name: N/A  . Number of Children: N/A  . 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 Smoking status: Former Smoker    Types: Cigarettes  . Smokeless tobacco: Never Used  . Alcohol Use: No  . Drug Use: No  . Sexual Activity: Not on file   Other Topics Concern  . Not on file   Social History Narrative   Married 1991   Lives in [place] with spouse.     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 Cancer Mother     ovarian cancer  . Cancer Other     breast cancer  . Breast cancer Other   . Cancer Other     breast cancer  . Coronary artery disease Other   . Heart attack Other   .  Breast cancer Other   . Colon cancer Neg Hx   . Heart attack Maternal Grandmother     Allergies  Allergen Reactions  . Levofloxacin Rash    Rash all over body    Medication list reviewed and updated in full in [facility].  GEN: No fevers, chills. Nontoxic. Primarily MSK c/o today. MSK: Detailed in the HPI GI: tolerating PO intake without difficulty Neuro: No numbness, parasthesias, or tingling associated. Otherwise the pertinent positives of the ROS are noted above.   Objective:   BP 96/70 mmHg  Pulse 86  Temp(Src) 98.2 F (36.8 C) (Oral)  Ht 5\' 2"  (1.575 m)  Wt 151 lb 12 oz (68.833 kg)  BMI 27.75 kg/m2   GEN: WDWN, NAD, Non-toxic, Alert &  Oriented x 3 HEENT: Atraumatic, Normocephalic.  Ears and Nose: No external deformity. EXTR: No clubbing/cyanosis/edema NEURO: Normal gait.  PSYCH: Normally interactive. Conversant. Not depressed or anxious appearing.  Calm demeanor.   L hand Ecchymosis or edema: neg ROM wrist/hand/digits: full  Carpals, MCP's, digits: NT Distal Ulna and Radius: NT Ecchymosis or edema: neg No instability Cysts/nodules: neg Digit triggering: neg Finkelstein's test: neg Snuffbox tenderness: neg Scaphoid tubercle: NT Resisted supination: NT Full composite fist, no malrotation Grip, all digits: 5/5 str DIPJT: NT PIP JT: NT MCP JT: 1st CMC is TTP globally Flexor 1st tendon sheath pain with ext and with palp Axial load test: neg Phalen's: neg Tinel's: neg Atrophy: neg  Hand sensation: intact   Radiology: Dg Hand Complete Left  04/08/2015  CLINICAL DATA:  Pain at the proximal base of the first metacarpal bone for several weeks. No history of trauma. EXAM: LEFT HAND - COMPLETE 3+ VIEW COMPARISON:  None. FINDINGS: There is no evidence of fracture or dislocation. There are mild osteoarthritic changes at the first carpometacarpal joint with joint space narrowing and subchondral sclerosis. No significant bony lesions. Bone mineralization is normal. Soft tissues are unremarkable. IMPRESSION: Mild osteoarthritic changes at the first carpometacarpal joint. Electronically Signed   By: Ted Mcalpine M.D.   On: 04/08/2015 14:50    Assessment and Plan:   Primary osteoarthritis of first carpometacarpal joint of left hand  Tenosynovitis of left hand  CMC arthritis with a first compartmental flexor tenosynovitis as well to a lesser extent. Oral and topical Voltaren.  She was also given a prescription for a thumb spica splint to wear at nighttime.  Follow-up: prn  New Prescriptions   DICLOFENAC (VOLTAREN) 75 MG EC TABLET    Take 1 tablet (75 mg total) by mouth 2 (two) times daily.   DICLOFENAC  SODIUM (VOLTAREN) 1 % GEL    Apply 2 g topically 4 (four) times daily.   Modified Medications   No medications on file   No orders of the defined types were placed in this encounter.    Signed,  Elpidio Galea. Ambra Haverstick, MD   Patient's Medications  New Prescriptions   DICLOFENAC (VOLTAREN) 75 MG EC TABLET    Take 1 tablet (75 mg total) by mouth 2 (two) times daily.   DICLOFENAC SODIUM (VOLTAREN) 1 % GEL    Apply 2 g topically 4 (four) times daily.  Previous Medications   ACETAMINOPHEN (TYLENOL) 500 MG TABLET    Take 500 mg by mouth every 6 (six) hours as needed (pain).    BLACK CURRANT SEED OIL 500 MG CAPS    Take 1 capsule by mouth 2 (two) times daily.   MISC NATURAL PRODUCTS (TURMERIC CURCUMIN) CAPS  Take 2 capsules by mouth 2 (two) times daily.  Modified Medications   No medications on file  Discontinued Medications   No medications on file

## 2015-04-20 NOTE — Progress Notes (Signed)
Pre visit review using our clinic review tool, if applicable. No additional management support is needed unless otherwise documented below in the visit note. 

## 2015-05-13 ENCOUNTER — Other Ambulatory Visit: Payer: Self-pay | Admitting: Family Medicine

## 2015-05-13 ENCOUNTER — Encounter: Payer: Self-pay | Admitting: Family Medicine

## 2015-05-13 ENCOUNTER — Ambulatory Visit (INDEPENDENT_AMBULATORY_CARE_PROVIDER_SITE_OTHER): Payer: BC Managed Care – PPO | Admitting: Family Medicine

## 2015-05-13 ENCOUNTER — Telehealth: Payer: Self-pay | Admitting: Family Medicine

## 2015-05-13 VITALS — BP 104/68 | HR 75 | Temp 98.3°F | Wt 151.8 lb

## 2015-05-13 DIAGNOSIS — R5383 Other fatigue: Secondary | ICD-10-CM | POA: Insufficient documentation

## 2015-05-13 DIAGNOSIS — R109 Unspecified abdominal pain: Secondary | ICD-10-CM

## 2015-05-13 DIAGNOSIS — M180 Bilateral primary osteoarthritis of first carpometacarpal joints: Secondary | ICD-10-CM

## 2015-05-13 DIAGNOSIS — M659 Synovitis and tenosynovitis, unspecified: Secondary | ICD-10-CM

## 2015-05-13 DIAGNOSIS — K219 Gastro-esophageal reflux disease without esophagitis: Secondary | ICD-10-CM | POA: Diagnosis not present

## 2015-05-13 DIAGNOSIS — M65949 Unspecified synovitis and tenosynovitis, unspecified hand: Secondary | ICD-10-CM

## 2015-05-13 LAB — LIPASE: LIPASE: 22 U/L (ref 11.0–59.0)

## 2015-05-13 LAB — CBC WITH DIFFERENTIAL/PLATELET
BASOS ABS: 0.1 10*3/uL (ref 0.0–0.1)
Basophils Relative: 1.4 % (ref 0.0–3.0)
Eosinophils Absolute: 0 10*3/uL (ref 0.0–0.7)
Eosinophils Relative: 0.5 % (ref 0.0–5.0)
HCT: 37.8 % (ref 36.0–46.0)
Hemoglobin: 12.8 g/dL (ref 12.0–15.0)
LYMPHS ABS: 2.2 10*3/uL (ref 0.7–4.0)
LYMPHS PCT: 22.5 % (ref 12.0–46.0)
MCHC: 33.8 g/dL (ref 30.0–36.0)
MCV: 91.5 fl (ref 78.0–100.0)
MONO ABS: 0.6 10*3/uL (ref 0.1–1.0)
MONOS PCT: 6.5 % (ref 3.0–12.0)
NEUTROS PCT: 69.1 % (ref 43.0–77.0)
Neutro Abs: 6.7 10*3/uL (ref 1.4–7.7)
PLATELETS: 357 10*3/uL (ref 150.0–400.0)
RBC: 4.13 Mil/uL (ref 3.87–5.11)
RDW: 12.7 % (ref 11.5–15.5)
WBC: 9.8 10*3/uL (ref 4.0–10.5)

## 2015-05-13 LAB — COMPREHENSIVE METABOLIC PANEL
ALK PHOS: 43 U/L (ref 39–117)
ALT: 23 U/L (ref 0–35)
AST: 16 U/L (ref 0–37)
Albumin: 4.2 g/dL (ref 3.5–5.2)
BUN: 12 mg/dL (ref 6–23)
CHLORIDE: 104 meq/L (ref 96–112)
CO2: 26 mEq/L (ref 19–32)
Calcium: 10 mg/dL (ref 8.4–10.5)
Creatinine, Ser: 0.96 mg/dL (ref 0.40–1.20)
GFR: 66.06 mL/min (ref >60.00)
GLUCOSE: 94 mg/dL (ref 70–99)
POTASSIUM: 4.5 meq/L (ref 3.5–5.1)
Sodium: 135 mEq/L (ref 135–145)
TOTAL PROTEIN: 7.3 g/dL (ref 6.0–8.3)
Total Bilirubin: 0.5 mg/dL (ref 0.2–1.2)

## 2015-05-13 LAB — VITAMIN B12: VITAMIN B 12: 318 pg/mL (ref 211–911)

## 2015-05-13 NOTE — Assessment & Plan Note (Signed)
With several other complaints- exam reassuring, no red flag symptoms. ? h pylori. Start work up with blood work today. Advised OTC PPI. Follow up in 2 weeks. Orders Placed This Encounter  Procedures  . H. pylori antibody, IgG  . Lipase  . Comprehensive metabolic panel  . Vitamin B12  . CBC with Differential/Platelet

## 2015-05-13 NOTE — Progress Notes (Signed)
Subjective:   Patient ID: Leah Armstrong, female    DOB: Jun 13, 1965, 50 y.o.   MRN: [id number]  Leah Armstrong is a pleasant 50 y.o. year old female who presents to clinic today with Dizziness; Abdominal Pain; Nausea; and Headache  on 05/13/2015  HPI:  Past few weeks, increased epigastric pain, fatigue and indigestion.  Was nauseated and dizzy yesterday but this resolved.  Had a frontal HA last week that also has since resolved.  No blood in stool.  No vomiting. No diarrhea. No fever.  Of note, CT of abdomen and pelvis from 11/04/14 (reviewed today)- showed no acute findings but did show evidence of diverticula.  Remote h/o hysterectomy.  Current Outpatient Prescriptions on File Prior to Visit  Medication Sig Dispense Refill  . acetaminophen (TYLENOL) 500 MG tablet Take 500 mg by mouth every 6 (six) hours as needed (pain).     . Black Currant Seed Oil 500 MG CAPS Take 1 capsule by mouth 2 (two) times daily.    . diclofenac (VOLTAREN) 75 MG EC tablet Take 1 tablet (75 mg total) by mouth 2 (two) times daily. 60 tablet 3  . diclofenac sodium (VOLTAREN) 1 % GEL Apply 2 g topically 4 (four) times daily. 3 Tube prn  . Misc Natural Products (TURMERIC CURCUMIN) CAPS Take 2 capsules by mouth 2 (two) times daily.     No current facility-administered medications on file prior to visit.    Allergies  Allergen Reactions  . Levofloxacin Rash    Rash all over body    Past Medical History  Diagnosis Date  . BACK PAIN 05/19/2010  . CERVICAL RADICULOPATHY 05/25/2009  . COLONIC POLYPS, HX OF 09/28/2007  . Diarrhea 09/28/2007  . Iron deficiency 11/01/2010  . SVT (supraventricular tachycardia) (HCC)   . Syncope     Past Surgical History  Procedure Laterality Date  . 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 Abdominal hysterectomy      ovaries intact  . 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  Supraventricular tachycardia ablation N/A 01/02/2014    Procedure: SUPRAVENTRICULAR TACHYCARDIA ABLATION;  Surgeon: Gardiner Rhyme, MD;  Location: [facility] CATH LAB;  Service: Cardiovascular;  Laterality: N/A;    Family History  Problem Relation Age of Onset  . Cancer Mother     ovarian cancer  . Cancer Other     breast cancer  . Breast cancer Other   . Cancer Other     breast cancer  . Coronary artery disease Other   . Heart attack Other   . Breast cancer Other   . Colon cancer Neg Hx   . Heart attack Maternal Grandmother     Social History   Social History  . Marital Status: Married    Spouse Name: N/A  . Number of Children: N/A  . 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 Smoking status: Former Smoker    Types: Cigarettes  . Smokeless tobacco: Never Used  . Alcohol Use: No  . Drug Use: No  . Sexual Activity: Not on file   Other Topics Concern  . Not on file   Social History Narrative   Married 1991   Lives in [place] with spouse.     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  Review of Systems  Constitutional: Positive for fatigue. Negative for fever.  Gastrointestinal: Positive for abdominal pain. Negative for nausea, vomiting, diarrhea, constipation, blood in stool, anal bleeding and rectal pain.  Genitourinary: Negative.   Musculoskeletal: Negative.   Skin: Negative.   Neurological: Positive for dizziness. Negative for tremors, seizures, syncope, facial asymmetry, speech difficulty, weakness, light-headedness, numbness and headaches.  Hematological: Negative.   Psychiatric/Behavioral: Negative.   All other systems reviewed and are negative.      Objective:    BP 104/68 mmHg  Pulse 75  Temp(Src) 98.3  F (36.8 C) (Oral)  Wt 151 lb 12 oz (68.833 kg)  SpO2 99%   Physical Exam  Constitutional: She is oriented to person, place, and time. She appears well-developed and well-nourished. No distress.  HENT:  Head: Normocephalic.  Eyes: Conjunctivae are normal.  Cardiovascular: Normal rate.   Pulmonary/Chest: Effort normal.  Abdominal: Soft. Bowel sounds are normal. She exhibits no distension and no mass. There is tenderness. There is no rebound and no guarding.  TTP over epigastrium  Musculoskeletal: Normal range of motion.  Neurological: She is alert and oriented to person, place, and time. No cranial nerve deficit.  Skin: Skin is warm and dry. She is not diaphoretic.  Psychiatric: She has a normal mood and affect. Her behavior is normal. Judgment and thought content normal.  Nursing note and vitals reviewed.         Assessment & Plan:   Gastroesophageal reflux disease, esophagitis presence not specified - Plan: H. pylori antibody, IgG, Lipase  Abdominal pain, unspecified abdominal location - Plan: H. pylori antibody, IgG, Lipase  Other fatigue - Plan: Comprehensive metabolic panel, Vitamin B12, CBC with Differential/Platelet No Follow-up on file.

## 2015-05-13 NOTE — Progress Notes (Signed)
Pre visit review using our clinic review tool, if applicable. No additional management support is needed unless otherwise documented below in the visit note. 

## 2015-05-13 NOTE — Telephone Encounter (Signed)
Pt left v/m requesting update of pharmacy list to [facility] [place]. Pharmacy list updated as requested. No cb needed.

## 2015-05-14 ENCOUNTER — Encounter: Payer: Self-pay | Admitting: Gastroenterology

## 2015-05-14 ENCOUNTER — Other Ambulatory Visit: Payer: Self-pay | Admitting: Family Medicine

## 2015-05-14 DIAGNOSIS — R1084 Generalized abdominal pain: Secondary | ICD-10-CM

## 2015-05-14 LAB — H. PYLORI ANTIBODY, IGG: H Pylori IgG: NEGATIVE

## 2015-07-10 ENCOUNTER — Ambulatory Visit (INDEPENDENT_AMBULATORY_CARE_PROVIDER_SITE_OTHER): Payer: BC Managed Care – PPO | Admitting: Gastroenterology

## 2015-07-10 ENCOUNTER — Encounter: Payer: Self-pay | Admitting: Gastroenterology

## 2015-07-10 VITALS — BP 110/70 | HR 72 | Ht 62.0 in | Wt 149.0 lb

## 2015-07-10 DIAGNOSIS — R109 Unspecified abdominal pain: Secondary | ICD-10-CM

## 2015-07-10 DIAGNOSIS — R195 Other fecal abnormalities: Secondary | ICD-10-CM | POA: Diagnosis not present

## 2015-07-10 MED ORDER — COLESTIPOL HCL 1 G PO TABS: 1.0000 g | ORAL_TABLET | Freq: Every day | ORAL | Status: AC

## 2015-07-10 NOTE — Patient Instructions (Signed)
Please start taking omeprazole (20mg  pill, one pill once daily shortly before BF meal). Stop alkaseltzer. You will be set up for an upper endoscopy. Will call in cholestyramine pill form, one pill once daily for chronic loose stools. Try lactaid pills (take 2 pills with any dairy meal).

## 2015-07-10 NOTE — Progress Notes (Signed)
Review of pertinent gastrointestinal problems: 1. Chronic loose stools for 2-3 decades. tTG was normal, IgA level was normal. Colonoscopy August 2009 with random biopsies was normal. Terminal ileum was normal. Imodium causes cramps. Cholestyramine works very effectively.  HPI: This is a   very pleasant 50 year old woman   who was referred to me by Dianne Dun, MD  to evaluate  chronic diarrhea, epigastric pain .    Chief complaint is chronic loose stools, epigastric pain   CT scan 10/2014 done for LLQ pains 1. No acute findings. No findings to explain left lower quadrant abdominal pain. 2. Colonic diverticula without evidence of diverticulitis. 3. Status post hysterectomy. 4. No other abnormalities.  Labs 05/2015 cbc, cmet, h.pylori serology all normal   cholestryamine was working for her loose stools previously but she stopped and the loose stools have returned.  Never sees blood in her stool  Feels hse hasn' t lost weight.  Takes goody's for headaches.  About 3-4 times per week. Will usually for 3-4 times per day, always loose.    Father and sister have chronic loose stools.   Takes mobic daily.  Epigastric pains for several months.   This is not associated with nausea or vomiting, it is a dull ache, rather constant. Eating tends to make it a little bit worse.  Takes alkaseltzer chewable daily for what she describes as indigestion  No nausae or vomiting.  Review of systems: Pertinent positive and negative review of systems were noted in the above HPI section. Complete review of systems was performed and was otherwise normal.   Past Medical History  Diagnosis Date  . BACK PAIN 05/19/2010  . CERVICAL RADICULOPATHY 05/25/2009  . COLONIC POLYPS, HX OF 09/28/2007  . Diarrhea 09/28/2007  . Iron deficiency 11/01/2010  . SVT (supraventricular tachycardia) (HCC)   . Syncope     Past Surgical History  Procedure Laterality Date  . 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  Abdominal hysterectomy      ovaries intact  . 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 Supraventricular tachycardia ablation N/A 01/02/2014    Procedure: SUPRAVENTRICULAR TACHYCARDIA ABLATION;  Surgeon: Gardiner Rhyme, MD;  Location: [facility] CATH LAB;  Service: Cardiovascular;  Laterality: N/A;    Current Outpatient Prescriptions  Medication Sig Dispense Refill  . acetaminophen (TYLENOL) 500 MG tablet Take 500 mg by mouth every 6 (six) hours as needed (pain).     . meloxicam (MOBIC) 7.5 MG tablet Take 7.5 mg by mouth daily.    . Misc Natural Products (OSTEO BI-FLEX ADV TRIPLE ST PO) Take 2 capsules by mouth at bedtime.     No current facility-administered medications for this visit.    Allergies as of 07/10/2015 - Review Complete 07/10/2015  Allergen Reaction Noted  . Levofloxacin Rash 02/14/2011    Family History  Problem Relation Age of Onset  . Cancer Mother     ovarian cancer  . Cancer Other     breast cancer  . Cancer Maternal Grandmother     breast cancer  . Coronary artery disease Maternal Grandmother   . Heart attack Maternal Grandmother   . Colon cancer Neg Hx   . Heart attack Brother     Social History   Social History  . Marital Status: Married    Spouse Name: N/A  . Number of Children: 2  . Years of Education: N/A  Occupational History  . 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 Smoking status: Former Smoker -- 6 years    Types: Cigarettes  . Smokeless tobacco: Never Used  . Alcohol Use: No  . Drug Use: No  . Sexual Activity: Not on file   Other Topics Concern  . Not on file   Social History Narrative   Married 1991   [place] in [place].     2 kids   Youth workerCafeteria manager for [employer]     Physical Exam: BP 110/70 mmHg  Pulse 72  Ht 5\' 2"  (1.575 m)  Wt 149 lb (67.586 kg)  BMI  27.25 kg/m2 Constitutional: generally well-appearing Psychiatric: alert and oriented x3 Eyes: extraocular movements intact Mouth: oral pharynx moist, no lesions Neck: supple no lymphadenopathy Cardiovascular: heart regular rate and rhythm Lungs: clear to auscultation bilaterally Abdomen: soft, nontender, nondistended, no obvious ascites, no peritoneal signs, normal bowel sounds Extremities: no lower extremity edema bilaterally Skin: no lesions on visible extremities   Assessment and plan: 50 y.o. female with  Chronic loose stools, new epigastric discomfort  First her chronic loose stools have been worked up previously. See the workup above. I see no reason to really do any of the workup and instead I will start her on the same medicine that was working for her previously. That is cholestyramine one dose once daily. For her new epigastric discomfort think it might be NSAID related. She takes mobile daily. She takes goody powders periodically. She takes Alka-Seltzer daily. I explained to her that these contain medicines that can all irritate the stomach. She is going to start taking proton pump inhibitor over-the-counter once daily for now and we will proceed with EGD at her soonest convenience to check for gastritis, peptic ulcer disease, underlying H. pylori infection. She believes she has lactose intolerance and that indeed may be the case from what she describes. I recommended she try Lactaid brand enzyme supplements and taper her dose up or down as needed   Rob Buntinganiel Jacobs, MD [facility] 07/10/2015, 8:56 AM  Cc: Dianne DunAron, Talia M, MD

## 2015-07-13 ENCOUNTER — Ambulatory Visit (AMBULATORY_SURGERY_CENTER): Payer: BC Managed Care – PPO | Admitting: Gastroenterology

## 2015-07-13 ENCOUNTER — Encounter: Payer: Self-pay | Admitting: Gastroenterology

## 2015-07-13 VITALS — BP 112/75 | HR 82 | Temp 98.4°F | Resp 15 | Ht 64.0 in | Wt 149.0 lb

## 2015-07-13 DIAGNOSIS — R109 Unspecified abdominal pain: Secondary | ICD-10-CM

## 2015-07-13 DIAGNOSIS — K2990 Gastroduodenitis, unspecified, without bleeding: Secondary | ICD-10-CM

## 2015-07-13 DIAGNOSIS — K2970 Gastritis, unspecified, without bleeding: Secondary | ICD-10-CM

## 2015-07-13 DIAGNOSIS — K2950 Unspecified chronic gastritis without bleeding: Secondary | ICD-10-CM | POA: Diagnosis not present

## 2015-07-13 MED ORDER — SODIUM CHLORIDE 0.9 % IV SOLN: 500.0000 mL | INTRAVENOUS | Status: DC

## 2015-07-13 NOTE — Patient Instructions (Signed)
Gastritis seen today, handout given. Biopsies taken. Result letter in your mail in 2-3 weeks.  Call us with any questions or concerns. Thank you!   YOU HAD AN ENDOSCOPIC PROCEDURE TODAY AT THE [facility]:   Refer to the procedure report that was given to you for any specific questions about what was found during the examination.  If the procedure report does not answer your questions, please call your gastroenterologist to clarify.  If you requested that your care partner not be given the details of your procedure findings, then the procedure report has been included in a sealed envelope for you to review at your convenience later.  YOU SHOULD EXPECT: Some feelings of bloating in the abdomen. Passage of more gas than usual.  Walking can help get rid of the air that was put into your GI tract during the procedure and reduce the bloating. If you had a lower endoscopy (such as a colonoscopy or flexible sigmoidoscopy) you may notice spotting of blood in your stool or on the toilet paper. If you underwent a bowel prep for your procedure, you may not have a normal bowel movement for a few days.  Please Note:  You might notice some irritation and congestion in your nose or some drainage.  This is from the oxygen used during your procedure.  There is no need for concern and it should clear up in a day or so.  SYMPTOMS TO REPORT IMMEDIATELY:    Following upper endoscopy (EGD)  Vomiting of blood or coffee ground material  New chest pain or pain under the shoulder blades  Painful or persistently difficult swallowing  New shortness of breath  Fever of 100F or higher  Black, tarry-looking stools  For urgent or emergent issues, a gastroenterologist can be reached at any hour by calling [phone].   DIET: Your first meal following the procedure should be a small meal and then it is ok to progress to your normal diet. Heavy or fried foods are harder to digest and may make you feel  nauseous or bloated.  Likewise, meals heavy in dairy and vegetables can increase bloating.  Drink plenty of fluids but you should avoid alcoholic beverages for 24 hours.  ACTIVITY:  You should plan to take it easy for the rest of today and you should NOT DRIVE or use heavy machinery until tomorrow (because of the sedation medicines used during the test).    FOLLOW UP: Our staff will call the number listed on your records the next business day following your procedure to check on you and address any questions or concerns that you may have regarding the information given to you following your procedure. If we do not reach you, we will leave a message.  However, if you are feeling well and you are not experiencing any problems, there is no need to return our call.  We will assume that you have returned to your regular daily activities without incident.  If any biopsies were taken you will be contacted by phone or by letter within the next 1-3 weeks.  Please call us at [phone] if you have not heard about the biopsies in 3 weeks.    SIGNATURES/CONFIDENTIALITY: You and/or your care partner have signed paperwork which will be entered into your electronic medical record.  These signatures attest to the fact that that the information above on your After Visit Summary has been reviewed and is understood.  Full responsibility of the confidentiality of this discharge  information lies with you and/or your care-partner. 

## 2015-07-13 NOTE — Op Note (Signed)
[facility] Patient Name: Leah Armstrong Procedure Date: 07/13/2015 8:07 AM MRN: [id number] Endoscopist: Rachael Fee , MD Age: 50 Date of Birth: Dec 07, 1965 Gender: Female Procedure:                Upper GI endoscopy Indications:              Dyspepsia Medicines:                Monitored Anesthesia Care Procedure:                Pre-Anesthesia Assessment:                           - Prior to the procedure, a History and Physical                            was performed, and patient medications and                            allergies were reviewed. The patient's tolerance of                            previous anesthesia was also reviewed. The risks                            and benefits of the procedure and the sedation                            options and risks were discussed with the patient.                            All questions were answered, and informed consent                            was obtained. Prior Anticoagulants: The patient has                            taken no previous anticoagulant or antiplatelet                            agents. ASA Grade Assessment: II - A patient with                            mild systemic disease. After reviewing the risks                            and benefits, the patient was deemed in                            satisfactory condition to undergo the procedure.                           After obtaining informed consent, the endoscope was  passed under direct vision. Throughout the                            procedure, the patient's blood pressure, pulse, and                            oxygen saturations were monitored continuously. The                            Model GIF-HQ190 [id number]) scope was introduced                            through the mouth, and advanced to the second part                            of duodenum. The upper GI endoscopy was   accomplished without difficulty. The patient                            tolerated the procedure well. Scope In: Scope Out: Findings:                 The esophagus was normal.                           Localized mild inflammation characterized by                            erosions was found in the gastric antrum. Biopsies                            were taken with a cold forceps for histology.                           There was a small amount of solid food in the                            stomach.                           The examined duodenum was normal. Complications:            No immediate complications. Estimated blood loss:                            None. Estimated Blood Loss:     Estimated blood loss: none. Impression:               - Normal esophagus.                           - Gastritis. Biopsied.                           - Small amount of retained solid food in the  stomach.                           - Normal examined duodenum. Recommendation:           - Patient has a contact number available for                            emergencies. The signs and symptoms of potential                            delayed complications were discussed with the                            patient. Return to normal activities tomorrow.                            Written discharge instructions were provided to the                            patient.                           - Resume previous diet.                           - Continue present medications.                           - Await pathology results. Rachael Feeaniel P Caliah Kopke, MD 07/13/2015 8:25:20 AM This report has been signed electronically.

## 2015-07-13 NOTE — Progress Notes (Signed)
Report to PACU, RN, vss, BBS= Clear.  

## 2015-07-13 NOTE — Progress Notes (Signed)
Called to room to assist during endoscopic procedure.  Patient ID and intended procedure confirmed with present staff. Received instructions for my participation in the procedure from the performing physician.  

## 2015-07-14 ENCOUNTER — Telehealth: Payer: Self-pay | Admitting: *Deleted

## 2015-07-14 NOTE — Telephone Encounter (Signed)
  Follow up Call-  Call back number 07/13/2015  Post procedure Call Back phone  # [phone]  Permission to leave phone message Yes     Patient questions:  Do you have a fever, pain , or abdominal swelling? No. Pain Score  0 *  Have you tolerated food without any problems? Yes.    Have you been able to return to your normal activities? Yes.    Do you have any questions about your discharge instructions: Diet   No. Medications  No. Follow up visit  No.  Do you have questions or concerns about your Care? No.  Actions: * If pain score is 4 or above: No action needed, pain <4.

## 2015-07-17 ENCOUNTER — Telehealth: Payer: Self-pay | Admitting: Internal Medicine

## 2015-07-17 NOTE — Telephone Encounter (Signed)
Pt states starting on Wednesday she developed palpitations/ lightheadedness/ dizziness/headaches.   Pt states palpitations have been off and on, worse episode was earlier today when she got really dizzy/nauseous  and felt she might pass out.

## 2015-07-17 NOTE — Telephone Encounter (Signed)
BP 120/81 and heart rate 81 right now, pt asymptomatic right now except feeling extremely tired.

## 2015-07-17 NOTE — Telephone Encounter (Signed)
Reviewed with Leah Armstrong.  Pt is aware an appt has been scheduled with Rudi Cocoonna Carroll, NP Monday May 8,2017 2:30PM.  Pt advised not to drive, pt advised to call EMS if symptomatic again ,especially to obtain  EKG to document rhythm, pt verbalized understanding.

## 2015-07-17 NOTE — Telephone Encounter (Signed)
New Message  Patient c/o Afib:  High priority if patient c/o lightheadedness and shortness of breath.  1. How long have you been in Afib? 3 days. Really bad heart burn nausea and today she was dizzy   2. Are you currently experiencing lightheadedness and shortness of breath? Light headedness and dizzy spells   3. Have you checked your BP and heart rate? (document readings) no   4. Are you experiencing any other symptoms? Heart burn in indigestion.   Last seen 2015 with Allred per chart

## 2015-07-20 ENCOUNTER — Inpatient Hospital Stay (HOSPITAL_COMMUNITY)
Admission: RE | Admit: 2015-07-20 | Payer: BC Managed Care – PPO | Source: Ambulatory Visit | Admitting: Nurse Practitioner

## 2015-07-21 ENCOUNTER — Encounter: Payer: Self-pay | Admitting: Gastroenterology

## 2015-08-25 ENCOUNTER — Encounter: Payer: Self-pay | Admitting: Internal Medicine

## 2015-08-25 ENCOUNTER — Ambulatory Visit (INDEPENDENT_AMBULATORY_CARE_PROVIDER_SITE_OTHER): Payer: BC Managed Care – PPO | Admitting: Internal Medicine

## 2015-08-25 VITALS — BP 118/78 | HR 91 | Temp 97.7°F | Wt 152.0 lb

## 2015-08-25 DIAGNOSIS — R0982 Postnasal drip: Secondary | ICD-10-CM

## 2015-08-25 DIAGNOSIS — R091 Pleurisy: Secondary | ICD-10-CM | POA: Diagnosis not present

## 2015-08-25 DIAGNOSIS — R059 Cough, unspecified: Secondary | ICD-10-CM

## 2015-08-25 DIAGNOSIS — R05 Cough: Secondary | ICD-10-CM | POA: Diagnosis not present

## 2015-08-25 MED ORDER — IBUPROFEN 600 MG PO TABS: 600.0000 mg | ORAL_TABLET | Freq: Three times a day (TID) | ORAL | Status: DC | PRN

## 2015-08-25 NOTE — Progress Notes (Addendum)
Subjective:    Patient ID: Leah Armstrong, female    DOB: [birth year], 50 y.o.   MRN: [id number]  HPI  Pt presents to the clinic today for ER follow up for pleurisy. She went to the ER with c/o chest pain. She reports they did ECG, chest xray, CTA chest and labs, all which were normal. They gave her Dilaudid and Tramadol which did provide some relief. She reports they discharged her and advised her to follow up with her PCP. She reports the pain is still intermittent. It is deep, behind her left breast. It is worse with taking a deep breath. She has had a dry cough for about 2 weeks. The cough is non productive. She denies shortness of breath. She has had some post nasal drip secondary to seasonal allergies, but has been taking Mucinex and Zyrtec with some relief. She has not taken anything OTC for the chest pain.  Review of Systems      Past Medical History  Diagnosis Date  . BACK PAIN 05/19/2010  . CERVICAL RADICULOPATHY 05/25/2009  . COLONIC POLYPS, HX OF 09/28/2007  . Diarrhea 09/28/2007  . Iron deficiency 11/01/2010  . SVT (supraventricular tachycardia) ([place])   . Syncope     Current Outpatient Prescriptions  Medication Sig Dispense Refill  . acetaminophen (TYLENOL) 500 MG tablet Take 500 mg by mouth every 6 (six) hours as needed (pain).     . colestipol (COLESTID) 1 g tablet Take 1 tablet (1 g total) by mouth daily. 30 tablet 3  . meloxicam (MOBIC) 7.5 MG tablet Take 7.5 mg by mouth daily.    . Misc Natural Products (OSTEO BI-FLEX ADV TRIPLE ST PO) Take 2 capsules by mouth at bedtime.    Marland Kitchen ibuprofen (ADVIL,MOTRIN) 600 MG tablet Take 1 tablet (600 mg total) by mouth every 8 (eight) hours as needed. 30 tablet 0   No current facility-administered medications for this visit.    Allergies  Allergen Reactions  . Levofloxacin Rash    Rash all over body    Family History  Problem Relation Age of Onset  . Cancer Mother     ovarian cancer  . Cancer Other     breast cancer  .  Cancer Maternal Grandmother     breast cancer  . Coronary artery disease Maternal Grandmother   . Heart attack Maternal Grandmother   . Colon cancer Neg Hx   . Heart attack Brother     Social History   Social History  . Marital Status: Married    Spouse Name: N/A  . Number of Children: 2  . Years of Education: N/A   Occupational History  . [employer]    Social History Main Topics  . Smoking status: Former Smoker -- 6 years    Types: Cigarettes  . Smokeless tobacco: Never Used  . Alcohol Use: No  . Drug Use: No  . Sexual Activity: Not on file   Other Topics Concern  . Not on file   Social History Narrative   Married 1991   Lives in [place] with spouse.     2 kids   Consulting civil engineer for [employer])     Constitutional: Denies fever, malaise, fatigue, headache or abrupt weight changes.  HEENT: Pt reports post nasal drip. Denies eye pain, eye redness, ear pain, ringing in the ears, wax buildup, runny nose, nasal congestion, bloody nose, or sore throat. Respiratory: Pt reports cough. Denies difficulty breathing, shortness  of breath, cough or sputum production.   Cardiovascular: Denies chest pain, chest tightness, palpitations or swelling in the hands or feet.  Musculoskeletal: Pt reports chest wall pain. Denies decrease in range of motion, difficulty with gait, or joint pain and swelling.   No other specific complaints in a complete review of systems (except as listed in HPI above).  Objective:   Physical Exam   BP 118/78 mmHg  Pulse 91  Temp(Src) 97.7 F (36.5 C) (Oral)  Wt 152 lb (68.947 kg)  SpO2 99% Wt Readings from Last 3 Encounters:  08/25/15 152 lb (68.947 kg)  07/13/15 149 lb (67.586 kg)  07/10/15 149 lb (67.586 kg)    General: Appears her stated age, well developed, well nourished in NAD. HEENT: Head: normal shape and size, no sinus tenderness noted; Throat/Mouth: Teeth present, mucosa  pink and moist, + PND, no exudate, lesions or ulcerations noted.  Neck:  No adenopathy noted. Cardiovascular: Normal rate and rhythm. S1,S2 noted.  No murmur, rubs or gallops noted.  Pulmonary/Chest: Normal effort and positive vesicular breath sounds. No respiratory distress. No wheezes, rales or ronchi noted.  Musculoskeletal: Chest pain not reproduced with palpation.   BMET    Component Value Date/Time   NA 135 05/13/2015 1057   NA 139 04/22/2012 1051   K 4.5 05/13/2015 1057   K 3.8 04/22/2012 1051   CL 104 05/13/2015 1057   CL 107 04/22/2012 1051   CO2 26 05/13/2015 1057   CO2 24 04/22/2012 1051   GLUCOSE 94 05/13/2015 1057   GLUCOSE 86 04/22/2012 1051   BUN 12 05/13/2015 1057   BUN 6* 04/22/2012 1051   CREATININE 0.96 05/13/2015 1057   CREATININE 0.81 01/01/2014 1703   CREATININE 0.89 04/22/2012 1051   CALCIUM 10.0 05/13/2015 1057   CALCIUM 9.0 04/22/2012 1051   GFRNONAA >60 04/22/2012 1051   GFRNONAA >60 10/14/2009 0510   GFRAA >60 04/22/2012 1051   GFRAA  10/14/2009 0510    >60        The eGFR has been calculated using the MDRD equation. This calculation has not been validated in all clinical situations. eGFR's persistently <60 mL/min signify possible Chronic Kidney Disease.    Lipid Panel     Component Value Date/Time   CHOL 150 05/14/2014 0855   TRIG 98.0 05/14/2014 0855   HDL 50.20 05/14/2014 0855   CHOLHDL 3 05/14/2014 0855   VLDL 19.6 05/14/2014 0855   LDLCALC 80 05/14/2014 0855    CBC    Component Value Date/Time   WBC 9.8 05/13/2015 1057   WBC 8.5 04/22/2012 1051   RBC 4.13 05/13/2015 1057   RBC 4.50 04/22/2012 1051   HGB 12.8 05/13/2015 1057   HGB 13.8 04/22/2012 1051   HCT 37.8 05/13/2015 1057   HCT 40.6 04/22/2012 1051   PLT 357.0 05/13/2015 1057   PLT 282 04/22/2012 1051   MCV 91.5 05/13/2015 1057   MCV 90 04/22/2012 1051   MCH 31.2 01/01/2014 1703   MCH 30.7 04/22/2012 1051   MCHC 33.8 05/13/2015 1057   MCHC 34.1 04/22/2012 1051    RDW 12.7 05/13/2015 1057   RDW 12.7 04/22/2012 1051   LYMPHSABS 2.2 05/13/2015 1057   MONOABS 0.6 05/13/2015 1057   EOSABS 0.0 05/13/2015 1057   BASOSABS 0.1 05/13/2015 1057    Hgb A1C No results found for: HGBA1C      Assessment & Plan:   ER followup for pleurisy:  No hospital notes to review, will request records Advised  her to switch Zyrtec to Xyzal Use Delsym as needed for cough No need to pursue further cardiac workup eRx for Ibuprofen 600 mg TID prn  Follow up with PCP in 1 week if symptoms persist  Shenise Wolgamott, NP

## 2015-08-25 NOTE — Patient Instructions (Signed)
Pleurisy  Pleurisy is an inflammation and swelling of the lining of the lungs (pleura). Because of this inflammation, it hurts to breathe. It can be aggravated by coughing, laughing, or deep breathing. Pleurisy is often caused by an underlying infection or disease.   HOME CARE INSTRUCTIONS   Monitor your pleurisy for any changes. The following actions may help to alleviate any discomfort you are experiencing:  · Medicine may help with pain. Only take over-the-counter or prescription medicines for pain, discomfort, or fever as directed by your health care provider.  · Only take antibiotic medicine as directed. Make sure to finish it even if you start to feel better.  SEEK MEDICAL CARE IF:   · Your pain is not controlled with medicine or is increasing.  · You have an increase in pus-like (purulent) secretions brought up with coughing.  SEEK IMMEDIATE MEDICAL CARE IF:   · You have blue or dark lips, fingernails, or toenails.  · You are coughing up blood.  · You have increased difficulty breathing.  · You have continuing pain unrelieved by medicine or pain lasting more than 1 week.  · You have pain that radiates into your neck, arms, or jaw.  · You develop increased shortness of breath or wheezing.  · You develop a fever, rash, vomiting, fainting, or other serious symptoms.  MAKE SURE YOU:  · Understand these instructions.    · Will watch your condition.    · Will get help right away if you are not doing well or get worse.        This information is not intended to replace advice given to you by your health care provider. Make sure you discuss any questions you have with your health care provider.     Document Released: 02/28/2005 Document Revised: 10/31/2012 Document Reviewed: 08/12/2012  Elsevier Interactive Patient Education ©2016 [employer]

## 2015-08-25 NOTE — Progress Notes (Signed)
Pre visit review using our clinic review tool, if applicable. No additional management support is needed unless otherwise documented below in the visit note. 

## 2015-12-22 ENCOUNTER — Other Ambulatory Visit: Payer: Self-pay | Admitting: Family Medicine

## 2015-12-22 DIAGNOSIS — Z1231 Encounter for screening mammogram for malignant neoplasm of breast: Secondary | ICD-10-CM

## 2015-12-24 ENCOUNTER — Ambulatory Visit
Admission: RE | Admit: 2015-12-24 | Discharge: 2015-12-24 | Disposition: A | Discharge status: Home / Self Care | Payer: Commercial Managed Care - PPO | Source: Ambulatory Visit | Attending: Family Medicine | Admitting: Family Medicine

## 2015-12-24 ENCOUNTER — Ambulatory Visit: Payer: BC Managed Care – PPO

## 2015-12-24 DIAGNOSIS — Z1231 Encounter for screening mammogram for malignant neoplasm of breast: Secondary | ICD-10-CM

## 2016-03-22 DIAGNOSIS — K580 Irritable bowel syndrome with diarrhea: Secondary | ICD-10-CM | POA: Diagnosis not present

## 2016-08-28 IMAGING — CT CT ABD-PELV W/ CM
1 of 3 series · 14 of 32 positions shown, 19 images · IV contrast (omnipaque)
Comparison: 01/03/2012

CLINICAL DATA: LLQ abdomen pain, sharp pain since last night
awaking patient from sleep with nausea. Denies vomiting and
diarrhea.

EXAM:
CT ABDOMEN AND PELVIS WITH CONTRAST
TECHNIQUE: Multidetector CT imaging of the abdomen and pelvis was performed
using the standard protocol following bolus administration of
intravenous contrast.
CONTRAST:  100mL OMNIPAQUE IOHEXOL 300 MG/ML  SOLN

[Series 2: routine abd pel with · axial · 0.67mm/px · z∈[-782,-402]mm · 14 of 86 slices shown, 19 images]
[im 5/86  soft-tissue]
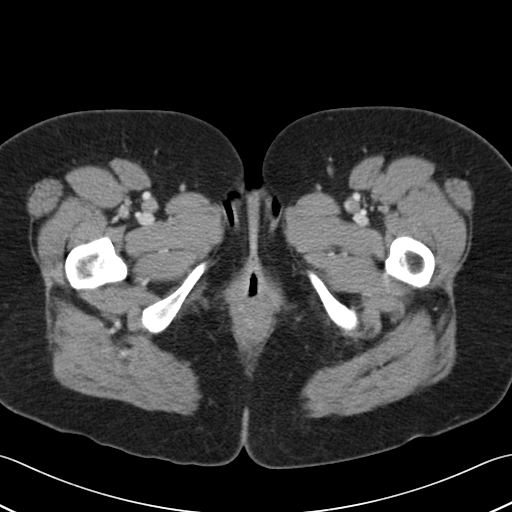
[im 5/86  bone]
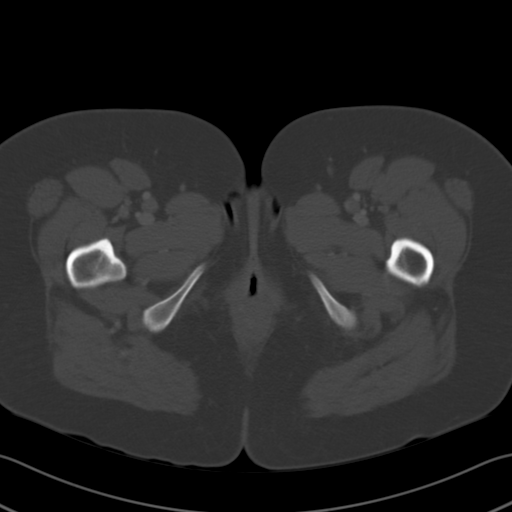
[im 13/86  soft-tissue]
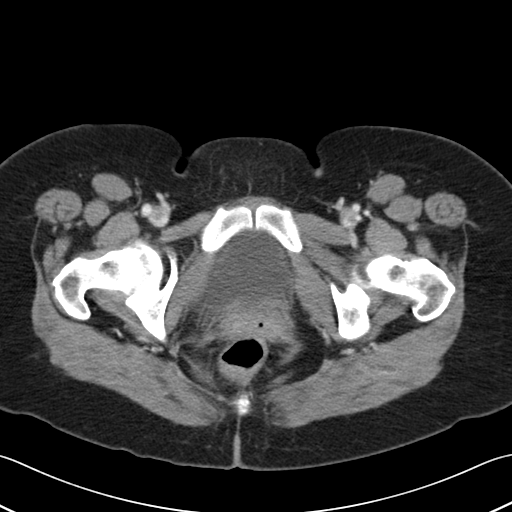
[im 18/86  soft-tissue]
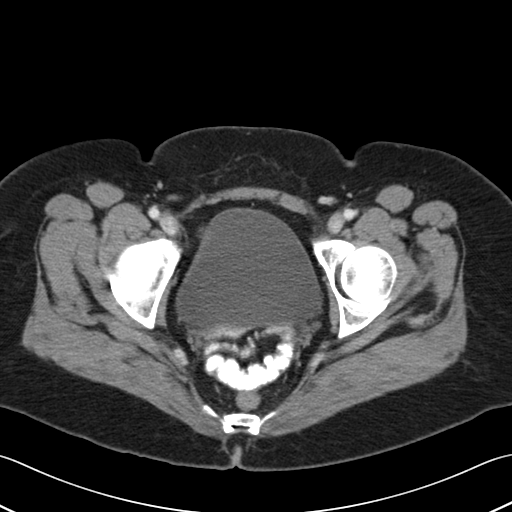
[im 26/86  soft-tissue]
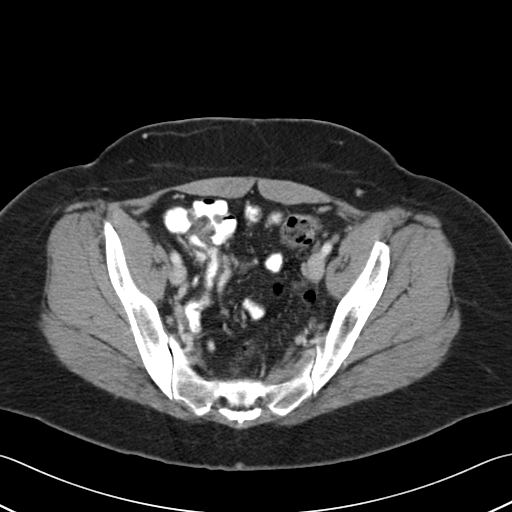
[im 30/86  soft-tissue]
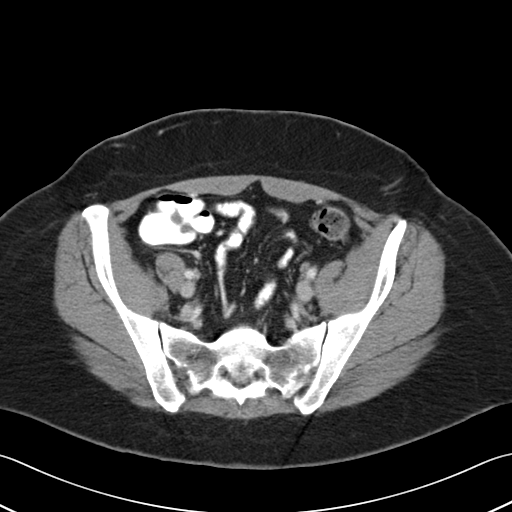
[im 39/86  soft-tissue]
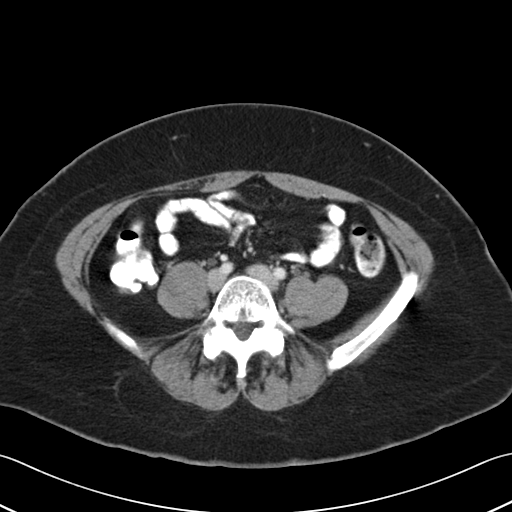
[im 43/86  soft-tissue]
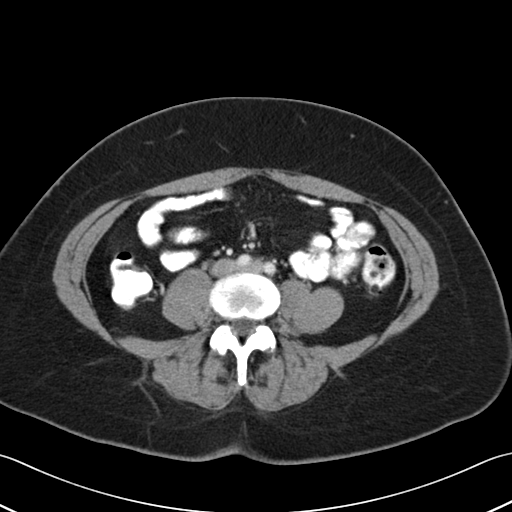
[im 47/86  soft-tissue]
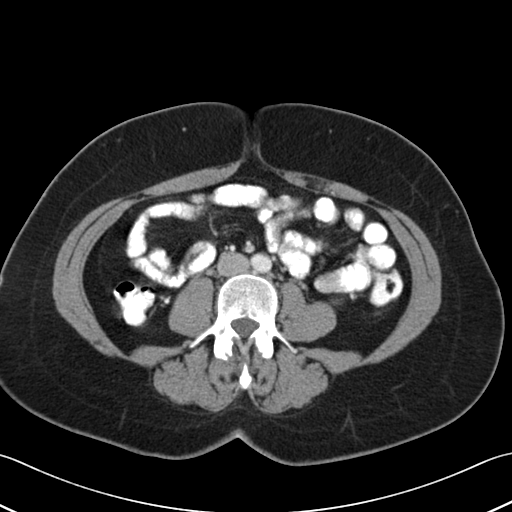
[im 56/86  soft-tissue]
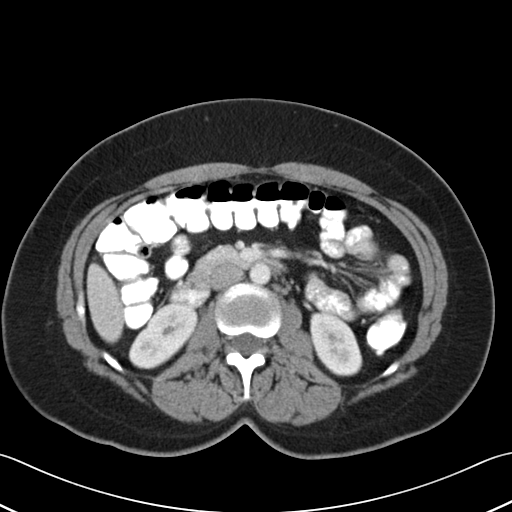
[im 56/86  bone]
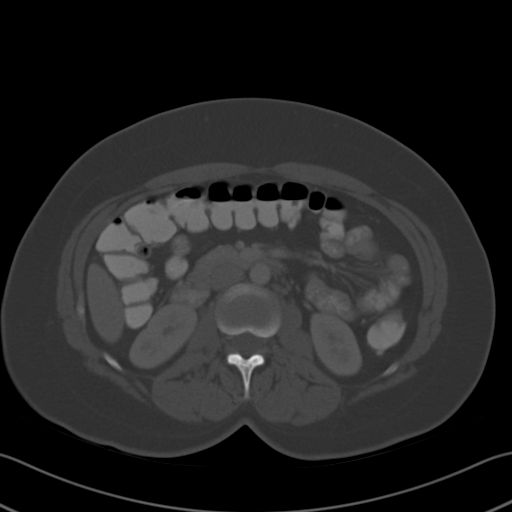
[im 60/86  soft-tissue]
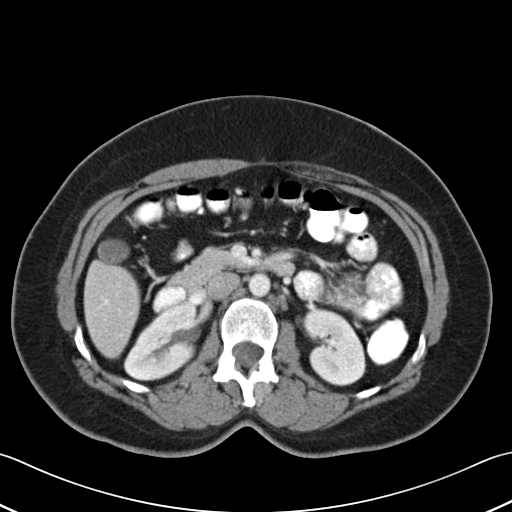
[im 69/86  soft-tissue]
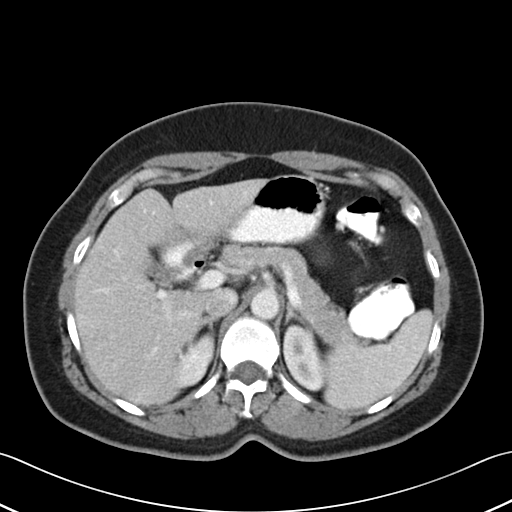
[im 69/86  lung]
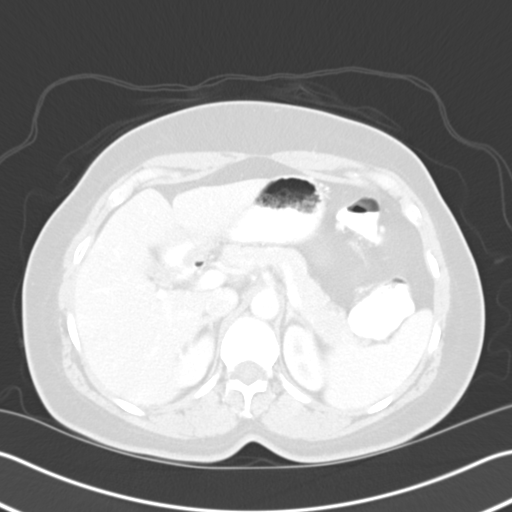
[im 73/86  soft-tissue]
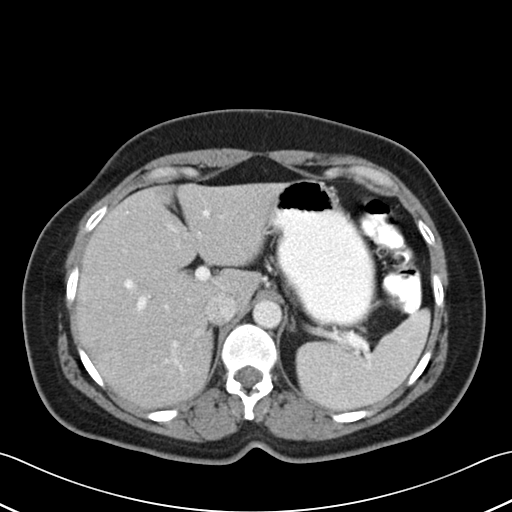
[im 73/86  lung]
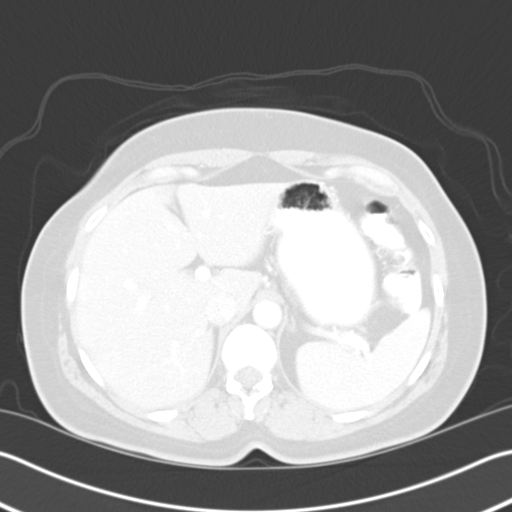
[im 77/86  lung]
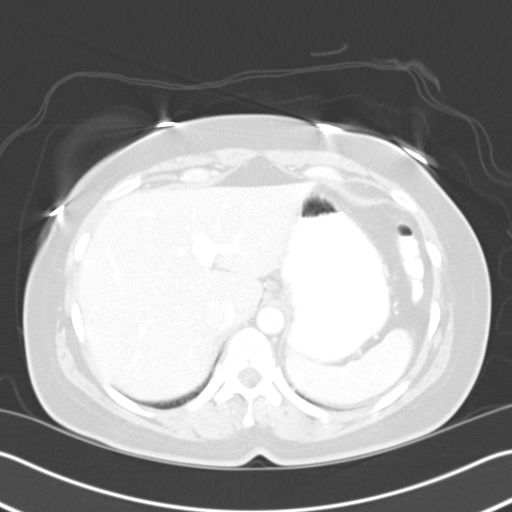
[im 81/86  soft-tissue]
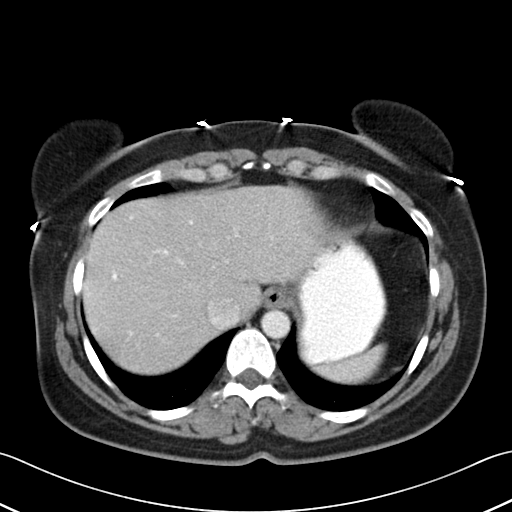
[im 81/86  lung]
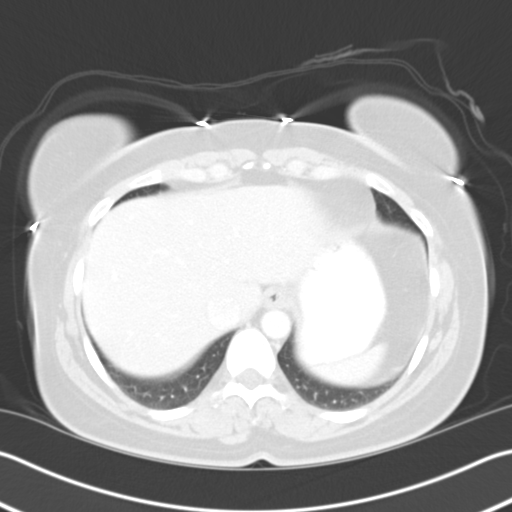

[14 of 32 positions shown; findings below may reference images not displayed]

FINDINGS: Lung bases:  Clear.  Heart normal in size.

Liver, spleen, gallbladder, pancreas, adrenal glands:  Normal.

Kidneys, ureters, bladder:  Unremarkable.

Uterus and adnexa:  Uterus surgically absent.  No adnexal masses.

Lymph nodes:  No adenopathy.

Ascites:  None.

Gastrointestinal: Scattered colonic diverticula. These predominate
along the sigmoid colon. No diverticulitis. No other evidence of
colonic inflammation. No bowel dilation. Stomach and small bowel are
unremarkable. Normal appendix visualized.

Musculoskeletal:  Unremarkable.
IMPRESSION: 1. No acute findings. No findings to explain left lower quadrant
abdominal pain.
2. Colonic diverticula without evidence of diverticulitis.
3. Status post hysterectomy.
4. No other abnormalities.

## 2016-09-09 DIAGNOSIS — Z79899 Other long term (current) drug therapy: Secondary | ICD-10-CM | POA: Diagnosis not present

## 2017-02-14 ENCOUNTER — Other Ambulatory Visit: Payer: Self-pay | Admitting: Family Medicine

## 2017-02-14 DIAGNOSIS — Z139 Encounter for screening, unspecified: Secondary | ICD-10-CM

## 2017-03-17 ENCOUNTER — Ambulatory Visit
Admission: RE | Admit: 2017-03-17 | Discharge: 2017-03-17 | Disposition: A | Discharge status: Home / Self Care | Payer: BC Managed Care – PPO | Source: Ambulatory Visit | Attending: Family Medicine | Admitting: Family Medicine

## 2017-03-17 DIAGNOSIS — Z139 Encounter for screening, unspecified: Secondary | ICD-10-CM

## 2017-03-20 ENCOUNTER — Other Ambulatory Visit: Payer: Self-pay | Admitting: Family Medicine

## 2017-03-20 DIAGNOSIS — R928 Other abnormal and inconclusive findings on diagnostic imaging of breast: Secondary | ICD-10-CM

## 2017-03-22 ENCOUNTER — Other Ambulatory Visit: Payer: Self-pay | Admitting: Family Medicine

## 2017-03-22 ENCOUNTER — Ambulatory Visit
Admission: RE | Admit: 2017-03-22 | Discharge: 2017-03-22 | Disposition: A | Discharge status: Home / Self Care | Payer: Managed Care, Other (non HMO) | Source: Ambulatory Visit | Attending: Family Medicine | Admitting: Family Medicine

## 2017-03-22 ENCOUNTER — Other Ambulatory Visit: Payer: Managed Care, Other (non HMO)

## 2017-03-22 DIAGNOSIS — R928 Other abnormal and inconclusive findings on diagnostic imaging of breast: Secondary | ICD-10-CM

## 2017-03-28 ENCOUNTER — Ambulatory Visit
Admission: RE | Admit: 2017-03-28 | Discharge: 2017-03-28 | Disposition: A | Discharge status: Home / Self Care | Payer: Managed Care, Other (non HMO) | Source: Ambulatory Visit | Attending: Family Medicine | Admitting: Family Medicine

## 2017-03-28 ENCOUNTER — Other Ambulatory Visit: Payer: Self-pay | Admitting: Family Medicine

## 2017-03-28 DIAGNOSIS — R928 Other abnormal and inconclusive findings on diagnostic imaging of breast: Secondary | ICD-10-CM

## 2017-04-06 ENCOUNTER — Other Ambulatory Visit: Payer: Self-pay | Admitting: General Surgery

## 2017-04-06 ENCOUNTER — Encounter (HOSPITAL_BASED_OUTPATIENT_CLINIC_OR_DEPARTMENT_OTHER): Payer: Self-pay | Admitting: *Deleted

## 2017-04-06 DIAGNOSIS — R928 Other abnormal and inconclusive findings on diagnostic imaging of breast: Secondary | ICD-10-CM

## 2017-04-06 NOTE — Progress Notes (Signed)
Ensure pre surgery drink given with instructions to complete by 0730 dos, pt verbalized understanding. 

## 2017-04-11 NOTE — H&P (Signed)
Leah Armstrong Documented: 04/06/2017 10:58 AM Location: [facility] Patient #: [id number] DOB: [birth year] Married / Language: [place] / Race: White Female   History of Present Illness Almond Lint MD; 04/06/2017 7:15 PM) The patient is a 52 year old female who presents with a complaint of Breast problems. Pt is a 52 yo F who presents with a screening detected area of distortion on routine screening mammography. Dx mammograms confirmed this. Core needle biopsy showed fibrocystic changes and adenosis. This was felt to be discordant. She is referred for consultation by Dr. Mayford Knife for this problem. She denies breast problems. She denies any significant breast pain. She did have some mild bruising from her biopsy. She is eager to get this taken care of.   Dx mammogram 03/22/17 ACR Breast Density Category b: There are scattered areas of fibroglandular density.  FINDINGS: Possible right breast distortion in the lateral central right breast persists on today's CC view.  On physical exam, no suspicious lumps are identified.  Targeted ultrasound is performed, showing no sonographic correlate to the possible distortion. No adenopathy.  IMPRESSION: Persistent distortion in the lateral right breast, best seen on the CC view.  RECOMMENDATION: Stereotactic biopsy of the right breast distortion.  I have discussed the findings and recommendations with the patient. Results were also provided in writing at the conclusion of the visit. If applicable, a reminder letter will be sent to the patient regarding the next appointment.  BI-RADS CATEGORY 4: Suspicious.  Pathology 03/28/17 Diagnosis Breast, right, needle core biopsy, outer - FIBROCYSTIC CHANGES AND ADENOSIS - MICROCALCIFICATIONS - NO MALIGNANCY IDENTIFIED    Past Surgical History (Tanisha A. Manson Passey, RMA; 04/06/2017 10:58 AM) Breast Biopsy  Right. Colon Polyp Removal - Colonoscopy  Hysterectomy (not due to  cancer) - Partial   Diagnostic Studies History (Tanisha A. Manson Passey, RMA; 04/06/2017 10:58 AM) Colonoscopy  1-5 years ago Mammogram  within last year Pap Smear  >5 years ago  Allergies (Tanisha A. Manson Passey, RMA; 04/06/2017 10:59 AM) LEVOFLOXACIN  Allergies Reconciled   Medication History (Tanisha A. Manson Passey, RMA; 04/06/2017 11:00 AM) Citalopram Hydrobromide (10MG  Tablet, Oral) Active. Colestipol HCl (1GM Tablet, Oral) Active. EstroNatural (Oral) Active. Imodium (2MG  Capsule, Oral) Active. ZyrTEC Allergy (10MG  Tablet, Oral) Active. Medications Reconciled  Social History (Tanisha A. Manson Passey, RMA; 04/06/2017 10:58 AM) Alcohol use  Occasional alcohol use. Caffeine use  Carbonated beverages, Tea. No drug use  Tobacco use  Former smoker.  Family History (Tanisha A. Manson Passey, RMA; 04/06/2017 10:58 AM) Arthritis  Father, Mother. Cervical Cancer  Mother. Heart Disease  Brother, Mother. Heart disease in female family member before age 34  Hypertension  Father, Mother. Ischemic Bowel Disease  Father. Thyroid problems  Mother.  Pregnancy / Birth History (Tanisha A. Manson Passey, RMA; 04/06/2017 10:58 AM) Age at menarche  16 years. Gravida  2 Maternal age  70-25 Para  2  Other Problems (Tanisha A. Manson Passey, RMA; 04/06/2017 10:58 AM) Arthritis  Atrial Fibrillation  Back Pain  Depression     Review of Systems (Tanisha A. Brown RMA; 04/06/2017 10:58 AM) General Present- Fatigue, Night Sweats and Weight Gain. Not Present- Appetite Loss, Chills, Fever and Weight Loss. Skin Not Present- Change in Wart/Mole, Dryness, Hives, Jaundice, New Lesions, Non-Healing Wounds, Rash and Ulcer. HEENT Present- Seasonal Allergies and Wears glasses/contact lenses. Not Present- Earache, Hearing Loss, Hoarseness, Nose Bleed, Oral Ulcers, Ringing in the Ears, Sinus Pain, Sore Throat, Visual Disturbances and Yellow Eyes. Respiratory Not Present- Bloody sputum, Chronic Cough, Difficulty Breathing, Snoring  and Wheezing.  Breast Not Present- Breast Mass, Breast Pain, Nipple Discharge and Skin Changes. Cardiovascular Present- Leg Cramps. Not Present- Chest Pain, Difficulty Breathing Lying Down, Palpitations, Rapid Heart Rate, Shortness of Breath and Swelling of Extremities. Gastrointestinal Present- Chronic diarrhea and Indigestion. Not Present- Abdominal Pain, Bloating, Bloody Stool, Change in Bowel Habits, Constipation, Difficulty Swallowing, Excessive gas, Gets full quickly at meals, Hemorrhoids, Nausea, Rectal Pain and Vomiting. Female Genitourinary Not Present- Frequency, Nocturia, Painful Urination, Pelvic Pain and Urgency. Musculoskeletal Not Present- Back Pain, Joint Pain, Joint Stiffness, Muscle Pain, Muscle Weakness and Swelling of Extremities. Neurological Not Present- Decreased Memory, Fainting, Headaches, Numbness, Seizures, Tingling, Tremor, Trouble walking and Weakness. Psychiatric Present- Anxiety and Change in Sleep Pattern. Not Present- Bipolar, Depression, Fearful and Frequent crying. Endocrine Present- Hot flashes. Not Present- Cold Intolerance, Excessive Hunger, Hair Changes, Heat Intolerance and New Diabetes.  Vitals (Tanisha A. Brown RMA; 04/06/2017 10:59 AM) 04/06/2017 10:59 AM Weight: 167.6 lb Height: 62in Body Surface Area: 1.77 m Body Mass Index: 30.65 kg/m  Temp.: 97.27F  Pulse: 92 (Regular)  BP: 128/74 (Sitting, Left Arm, Standard)       Physical Exam Almond Lint(Eilene Voigt MD; 04/06/2017 7:16 PM) General Mental Status-Alert. General Appearance-Consistent with stated age. Hydration-Well hydrated. Voice-Normal.  Head and Neck Head-normocephalic, atraumatic with no lesions or palpable masses. Trachea-midline. Thyroid Gland Characteristics - normal size and consistency.  Eye Eyeball - Bilateral-Extraocular movements intact. Sclera/Conjunctiva - Bilateral-No scleral icterus.  Chest and Lung Exam Chest and lung exam reveals -quiet, even  and easy respiratory effort with no use of accessory muscles and on auscultation, normal breath sounds, no adventitious sounds and normal vocal resonance. Inspection Chest Wall - Normal. Back - normal.  Breast Note: breasts wtih mild ptosis bilaterally. no palpable masses. Sore on right from biopsy. Bruising present. No nipple retraction or nipple discharge. No skin dimpling. No LAD.   Cardiovascular Cardiovascular examination reveals -normal heart sounds, regular rate and rhythm with no murmurs and normal pedal pulses bilaterally.  Abdomen Inspection Inspection of the abdomen reveals - No Hernias. Palpation/Percussion Palpation and Percussion of the abdomen reveal - Soft, Non Tender, No Rebound tenderness, No Rigidity (guarding) and No hepatosplenomegaly. Auscultation Auscultation of the abdomen reveals - Bowel sounds normal.  Neurologic Neurologic evaluation reveals -alert and oriented x 3 with no impairment of recent or remote memory. Mental Status-Normal.  Musculoskeletal Global Assessment -Note: no gross deformities.  Normal Exam - Left-Upper Extremity Strength Normal and Lower Extremity Strength Normal. Normal Exam - Right-Upper Extremity Strength Normal and Lower Extremity Strength Normal.  Lymphatic Head & Neck  General Head & Neck Lymphatics: Bilateral - Description - Normal. Axillary  General Axillary Region: Bilateral - Description - Normal. Tenderness - Non Tender. Femoral & Inguinal  Generalized Femoral & Inguinal Lymphatics: Bilateral - Description - No Generalized lymphadenopathy.    Assessment & Plan Almond Lint(Mychal Durio MD; 04/06/2017 7:18 PM) ABNORMAL MAMMOGRAM OF RIGHT BREAST (R92.8) Impression: I will plan a seed localized excisional breast biopsy on the right for discordant biopsy results. I reviewed the rationale for this. I discussed that there is a risk of having noninvasive or invasive breast cancer. I reviewed the process by which to get a  seed placed and the timing of surgery. She is going to work around a trip to see her son who has been deployed. I reviewed postoperative restrictions. She works in Clinical biochemistcustomer service and I do feel it would be reasonable for her to go back to work in few days if she is not requiring narcotics  and is otherwise clinically well.  I discussed that if the biopsy ends up being benign, that she will just need to go back to regular annual screening mammograms. I discussed that if there is DCIS or invasive cancer found, that we frequently need to go back for additional surgeries. I also discussed that a diagnosis of cancer would lead to additional treatment that could include radiation, chemotherapy, or anti-hormonal treatment. Current Plans Pt Education - [facility] Breast Biopsy [facility]CI: discussed with patient and provided information. Schedule for Surgery   Signed by Almond Lint, MD (04/06/2017 7:18 PM)

## 2017-04-12 ENCOUNTER — Ambulatory Visit
Admission: RE | Admit: 2017-04-12 | Discharge: 2017-04-12 | Disposition: A | Discharge status: Home / Self Care | Payer: Managed Care, Other (non HMO) | Source: Ambulatory Visit | Attending: General Surgery | Admitting: General Surgery

## 2017-04-12 DIAGNOSIS — R928 Other abnormal and inconclusive findings on diagnostic imaging of breast: Secondary | ICD-10-CM

## 2017-04-13 ENCOUNTER — Encounter (HOSPITAL_BASED_OUTPATIENT_CLINIC_OR_DEPARTMENT_OTHER): Payer: Self-pay

## 2017-04-13 ENCOUNTER — Ambulatory Visit (HOSPITAL_BASED_OUTPATIENT_CLINIC_OR_DEPARTMENT_OTHER)
Admission: RE | Admit: 2017-04-13 | Discharge: 2017-04-13 | Disposition: A | Discharge status: Home / Self Care | Payer: Managed Care, Other (non HMO) | Source: Ambulatory Visit | Attending: General Surgery | Admitting: General Surgery

## 2017-04-13 ENCOUNTER — Ambulatory Visit
Admission: RE | Admit: 2017-04-13 | Discharge: 2017-04-13 | Disposition: A | Discharge status: Home / Self Care | Payer: Managed Care, Other (non HMO) | Source: Ambulatory Visit | Attending: General Surgery | Admitting: General Surgery

## 2017-04-13 ENCOUNTER — Ambulatory Visit (HOSPITAL_BASED_OUTPATIENT_CLINIC_OR_DEPARTMENT_OTHER): Payer: Managed Care, Other (non HMO) | Admitting: Anesthesiology

## 2017-04-13 ENCOUNTER — Encounter (HOSPITAL_BASED_OUTPATIENT_CLINIC_OR_DEPARTMENT_OTHER)
Admission: RE | Disposition: A | Discharge status: Home / Self Care | Payer: Self-pay | Source: Ambulatory Visit | Attending: General Surgery

## 2017-04-13 ENCOUNTER — Other Ambulatory Visit: Payer: Self-pay

## 2017-04-13 DIAGNOSIS — R928 Other abnormal and inconclusive findings on diagnostic imaging of breast: Secondary | ICD-10-CM | POA: Diagnosis present

## 2017-04-13 DIAGNOSIS — F329 Major depressive disorder, single episode, unspecified: Secondary | ICD-10-CM | POA: Diagnosis not present

## 2017-04-13 DIAGNOSIS — Z79899 Other long term (current) drug therapy: Secondary | ICD-10-CM | POA: Diagnosis not present

## 2017-04-13 DIAGNOSIS — N6011 Diffuse cystic mastopathy of right breast: Secondary | ICD-10-CM | POA: Diagnosis not present

## 2017-04-13 DIAGNOSIS — I4891 Unspecified atrial fibrillation: Secondary | ICD-10-CM | POA: Insufficient documentation

## 2017-04-13 DIAGNOSIS — M1990 Unspecified osteoarthritis, unspecified site: Secondary | ICD-10-CM | POA: Insufficient documentation

## 2017-04-13 DIAGNOSIS — N6021 Fibroadenosis of right breast: Secondary | ICD-10-CM | POA: Insufficient documentation

## 2017-04-13 DIAGNOSIS — Z87891 Personal history of nicotine dependence: Secondary | ICD-10-CM | POA: Insufficient documentation

## 2017-04-13 DIAGNOSIS — M549 Dorsalgia, unspecified: Secondary | ICD-10-CM | POA: Insufficient documentation

## 2017-04-13 DIAGNOSIS — K219 Gastro-esophageal reflux disease without esophagitis: Secondary | ICD-10-CM | POA: Insufficient documentation

## 2017-04-13 HISTORY — PX: RADIOACTIVE SEED GUIDED EXCISIONAL BREAST BIOPSY: SHX6490

## 2017-04-13 SURGERY — RADIOACTIVE SEED GUIDED BREAST BIOPSY
Anesthesia: General | Site: Breast | Laterality: Right

## 2017-04-13 MED ORDER — CEFAZOLIN SODIUM-DEXTROSE 2-4 GM/100ML-% IV SOLN
INTRAVENOUS | Status: AC
  Filled 2017-04-13: qty 100

## 2017-04-13 MED ORDER — HYDROMORPHONE HCL 1 MG/ML IJ SOLN: 0.2500 mg | INTRAMUSCULAR | Status: DC | PRN

## 2017-04-13 MED ORDER — MIDAZOLAM HCL 2 MG/2ML IJ SOLN
1.0000 mg | INTRAMUSCULAR | Status: DC | PRN
  Administered 2017-04-13: 2 mg via INTRAVENOUS

## 2017-04-13 MED ORDER — CEFAZOLIN SODIUM-DEXTROSE 2-4 GM/100ML-% IV SOLN
2.0000 g | INTRAVENOUS | Status: AC
  Administered 2017-04-13: 2 g via INTRAVENOUS

## 2017-04-13 MED ORDER — GABAPENTIN 300 MG PO CAPS
300.0000 mg | ORAL_CAPSULE | ORAL | Status: AC
  Administered 2017-04-13: 300 mg via ORAL

## 2017-04-13 MED ORDER — ONDANSETRON HCL 4 MG/2ML IJ SOLN
INTRAMUSCULAR | Status: DC | PRN
  Administered 2017-04-13: 4 mg via INTRAVENOUS

## 2017-04-13 MED ORDER — FENTANYL CITRATE (PF) 100 MCG/2ML IJ SOLN
INTRAMUSCULAR | Status: AC
  Filled 2017-04-13: qty 2

## 2017-04-13 MED ORDER — OXYCODONE HCL 5 MG PO TABS: 5.0000 mg | ORAL_TABLET | Freq: Four times a day (QID) | ORAL | 0 refills | Status: AC | PRN

## 2017-04-13 MED ORDER — CHLORHEXIDINE GLUCONATE CLOTH 2 % EX PADS: 6.0000 | MEDICATED_PAD | Freq: Once | CUTANEOUS | Status: DC

## 2017-04-13 MED ORDER — DEXAMETHASONE SODIUM PHOSPHATE 10 MG/ML IJ SOLN
INTRAMUSCULAR | Status: DC | PRN
  Administered 2017-04-13: 10 mg via INTRAVENOUS

## 2017-04-13 MED ORDER — FENTANYL CITRATE (PF) 100 MCG/2ML IJ SOLN
50.0000 ug | INTRAMUSCULAR | Status: DC | PRN
  Administered 2017-04-13: 50 ug via INTRAVENOUS

## 2017-04-13 MED ORDER — SCOPOLAMINE 1 MG/3DAYS TD PT72: 1.0000 | MEDICATED_PATCH | Freq: Once | TRANSDERMAL | Status: DC | PRN

## 2017-04-13 MED ORDER — GABAPENTIN 300 MG PO CAPS
ORAL_CAPSULE | ORAL | Status: AC
  Filled 2017-04-13: qty 1

## 2017-04-13 MED ORDER — MIDAZOLAM HCL 2 MG/2ML IJ SOLN
INTRAMUSCULAR | Status: AC
  Filled 2017-04-13: qty 2

## 2017-04-13 MED ORDER — BUPIVACAINE-EPINEPHRINE (PF) 0.5% -1:200000 IJ SOLN
INTRAMUSCULAR | Status: AC
  Filled 2017-04-13: qty 30

## 2017-04-13 MED ORDER — EPHEDRINE SULFATE-NACL 50-0.9 MG/10ML-% IV SOSY
PREFILLED_SYRINGE | INTRAVENOUS | Status: DC | PRN
  Administered 2017-04-13 (×2): 10 mg via INTRAVENOUS

## 2017-04-13 MED ORDER — CELECOXIB 200 MG PO CAPS
200.0000 mg | ORAL_CAPSULE | ORAL | Status: AC
  Administered 2017-04-13: 200 mg via ORAL

## 2017-04-13 MED ORDER — CELECOXIB 200 MG PO CAPS
ORAL_CAPSULE | ORAL | Status: AC
  Filled 2017-04-13: qty 1

## 2017-04-13 MED ORDER — PROPOFOL 10 MG/ML IV BOLUS
INTRAVENOUS | Status: DC | PRN
  Administered 2017-04-13: 50 mg via INTRAVENOUS
  Administered 2017-04-13: 100 mg via INTRAVENOUS

## 2017-04-13 MED ORDER — ACETAMINOPHEN 500 MG PO TABS
ORAL_TABLET | ORAL | Status: AC
  Filled 2017-04-13: qty 2

## 2017-04-13 MED ORDER — PROPOFOL 10 MG/ML IV BOLUS
INTRAVENOUS | Status: AC
  Filled 2017-04-13: qty 20

## 2017-04-13 MED ORDER — ACETAMINOPHEN 500 MG PO TABS
1000.0000 mg | ORAL_TABLET | ORAL | Status: AC
  Administered 2017-04-13: 1000 mg via ORAL

## 2017-04-13 MED ORDER — LIDOCAINE 2% (20 MG/ML) 5 ML SYRINGE
INTRAMUSCULAR | Status: DC | PRN
  Administered 2017-04-13: 60 mg via INTRAVENOUS

## 2017-04-13 MED ORDER — EPHEDRINE 5 MG/ML INJ
INTRAVENOUS | Status: AC
  Filled 2017-04-13: qty 10

## 2017-04-13 MED ORDER — LIDOCAINE HCL (PF) 1 % IJ SOLN
INTRAMUSCULAR | Status: AC
  Filled 2017-04-13: qty 30

## 2017-04-13 MED ORDER — LACTATED RINGERS IV SOLN
INTRAVENOUS | Status: DC
  Administered 2017-04-13: 12:00:00 via INTRAVENOUS

## 2017-04-13 SURGICAL SUPPLY — 50 items
ADH SKN CLS APL DERMABOND .7 (GAUZE/BANDAGES/DRESSINGS) ×1
BINDER BREAST XLRG (GAUZE/BANDAGES/DRESSINGS) ×2 IMPLANT
BLADE SURG 10 STRL SS (BLADE) ×3 IMPLANT
CANISTER SUCT 1200ML W/VALVE (MISCELLANEOUS) ×3 IMPLANT
CHLORAPREP W/TINT 26ML (MISCELLANEOUS) ×3 IMPLANT
CLIP VESOCCLUDE LG 6/CT (CLIP) ×3 IMPLANT
CLOSURE WOUND 1/2 X4 (GAUZE/BANDAGES/DRESSINGS) ×1
COVER BACK TABLE 60X90IN (DRAPES) ×3 IMPLANT
COVER MAYO STAND STRL (DRAPES) ×3 IMPLANT
COVER PROBE W GEL 5X96 (DRAPES) ×3 IMPLANT
DERMABOND ADVANCED (GAUZE/BANDAGES/DRESSINGS) ×2
DERMABOND ADVANCED .7 DNX12 (GAUZE/BANDAGES/DRESSINGS) ×1 IMPLANT
DEVICE DUBIN W/COMP PLATE 8390 (MISCELLANEOUS) ×3 IMPLANT
DRAPE LAPAROSCOPIC ABDOMINAL (DRAPES) ×3 IMPLANT
DRAPE UTILITY XL STRL (DRAPES) ×3 IMPLANT
DRSG PAD ABDOMINAL 8X10 ST (GAUZE/BANDAGES/DRESSINGS) ×2 IMPLANT
ELECT COATED BLADE 2.86 ST (ELECTRODE) ×3 IMPLANT
ELECT REM PT RETURN 9FT ADLT (ELECTROSURGICAL) ×3
ELECTRODE REM PT RTRN 9FT ADLT (ELECTROSURGICAL) ×1 IMPLANT
GAUZE SPONGE 4X4 12PLY STRL LF (GAUZE/BANDAGES/DRESSINGS) ×3 IMPLANT
GLOVE BIO SURGEON STRL SZ 6 (GLOVE) ×3 IMPLANT
GLOVE BIOGEL PI IND STRL 6.5 (GLOVE) ×1 IMPLANT
GLOVE BIOGEL PI INDICATOR 6.5 (GLOVE) ×2
GOWN STRL REUS W/ TWL LRG LVL3 (GOWN DISPOSABLE) ×1 IMPLANT
GOWN STRL REUS W/TWL 2XL LVL3 (GOWN DISPOSABLE) ×3 IMPLANT
GOWN STRL REUS W/TWL LRG LVL3 (GOWN DISPOSABLE) ×3
KIT MARKER MARGIN INK (KITS) ×3 IMPLANT
LIGHT WAVEGUIDE WIDE FLAT (MISCELLANEOUS) ×2 IMPLANT
NDL HYPO 25X1 1.5 SAFETY (NEEDLE) ×1 IMPLANT
NEEDLE HYPO 25X1 1.5 SAFETY (NEEDLE) ×3 IMPLANT
NS IRRIG 1000ML POUR BTL (IV SOLUTION) ×3 IMPLANT
PACK BASIN DAY SURGERY FS (CUSTOM PROCEDURE TRAY) ×3 IMPLANT
PENCIL BUTTON HOLSTER BLD 10FT (ELECTRODE) ×3 IMPLANT
SLEEVE SCD COMPRESS KNEE MED (MISCELLANEOUS) ×3 IMPLANT
SPONGE LAP 18X18 X RAY DECT (DISPOSABLE) ×3 IMPLANT
STAPLER VISISTAT 35W (STAPLE) IMPLANT
STRIP CLOSURE SKIN 1/2X4 (GAUZE/BANDAGES/DRESSINGS) ×2 IMPLANT
SUT MON AB 4-0 PC3 18 (SUTURE) ×3 IMPLANT
SUT SILK 2 0 SH (SUTURE) IMPLANT
SUT VIC AB 2-0 SH 18 (SUTURE) IMPLANT
SUT VIC AB 3-0 SH 27 (SUTURE) ×3
SUT VIC AB 3-0 SH 27X BRD (SUTURE) ×1 IMPLANT
SUT VICRYL 3-0 CR8 SH (SUTURE) ×2 IMPLANT
SYR BULB 3OZ (MISCELLANEOUS) ×3 IMPLANT
SYR CONTROL 10ML LL (SYRINGE) ×3 IMPLANT
TOWEL OR 17X24 6PK STRL BLUE (TOWEL DISPOSABLE) ×3 IMPLANT
TOWEL OR NON WOVEN STRL DISP B (DISPOSABLE) ×2 IMPLANT
TUBE CONNECTING 20'X1/4 (TUBING) ×1
TUBE CONNECTING 20X1/4 (TUBING) ×2 IMPLANT
YANKAUER SUCT BULB TIP NO VENT (SUCTIONS) ×3 IMPLANT

## 2017-04-13 NOTE — Transfer of Care (Signed)
Immediate Anesthesia Transfer of Care Note  Patient: Leah MillardStephanie M Burnstein  Procedure(s) Performed: RADIOACTIVE SEED GUIDED EXCISIONAL BREAST BIOPSY (Right Breast)  Patient Location: PACU  Anesthesia Type:General  Level of Consciousness: drowsy  Airway & Oxygen Therapy: Patient Spontanous Breathing and Patient connected to face mask oxygen  Post-op Assessment: Report given to RN and Post -op Vital signs reviewed and stable  Post vital signs: Reviewed and stable  Last Vitals:  Vitals:   04/13/17 0953 04/13/17 1313  BP: 108/71 (!) 104/58  Pulse: 79 78  Resp: 18 17  Temp: 36.7 C   SpO2: 98% 100%    Last Pain:  Vitals:   04/13/17 0953  TempSrc: Oral         Complications: No apparent anesthesia complications

## 2017-04-13 NOTE — Op Note (Signed)
Right Breast Radioactive seed localized excisional biopsy  Indications: This patient presents with history of abnormal right mammogram and discordant core needle biopsy  Pre-operative Diagnosis: abnormal right mammogram  Post-operative Diagnosis: Same  Surgeon: Stark Klein   Anesthesia: General endotracheal anesthesia  ASA Class: 2  Procedure Details  The patient was seen in the Holding Room. The risks, benefits, complications, treatment options, and expected outcomes were discussed with the patient. The possibilities of bleeding, infection, the need for additional procedures, failure to diagnose a condition, and creating a complication requiring transfusion or operation were discussed with the patient. The patient concurred with the proposed plan, giving informed consent.  The site of surgery properly noted/marked. The patient was taken to Operating Room # 7, identified, and the procedure verified as Right Breast Seed localized excisional biopsy. A Time Out was held and the above information confirmed.  The right breast and chest were prepped and draped in standard fashion. The excisional biopsy was performed by creating a circumlinear incision over the lateral breast near the previously placed radioactive seed.  Dissection was carried down  around the point of maximum signal intensity. The cautery was used to perform the dissection.  Hemostasis was achieved with cautery. The specimen was inked with the margin marker paint kit.    Specimen radiography confirmed inclusion of the mammographic lesion, the clip, and the seed.  The background signal in the breast was zero.  The wound was irrigated and closed with 3-0 vicryl in layers and 4-0 monocryl subcuticular suture.      Sterile dressings were applied. At the end of the operation, all sponge, instrument, and needle counts were correct.  Findings: grossly clear surgical margins and no adenopathy  Estimated Blood Loss:  min          Specimens: right breast excisional biopsy with seed         Complications:  None; patient tolerated the procedure well.         Disposition: PACU - hemodynamically stable.         Condition: stable

## 2017-04-13 NOTE — Anesthesia Preprocedure Evaluation (Addendum)
Anesthesia Evaluation  Patient identified by MRN, date of birth, ID band Patient awake    Reviewed: Allergy & Precautions, H&P , NPO status , Patient's Chart, lab work & pertinent test results  Airway Mallampati: II  TM Distance: >3 FB Neck ROM: Full    Dental no notable dental hx. (+) Teeth Intact, Dental Advisory Given   Pulmonary neg pulmonary ROS, former smoker,    Pulmonary exam normal breath sounds clear to auscultation       Cardiovascular negative cardio ROS   Rhythm:Regular Rate:Normal     Neuro/Psych negative neurological ROS  negative psych ROS   GI/Hepatic negative GI ROS, Neg liver ROS, GERD  Controlled,  Endo/Other  negative endocrine ROS  Renal/GU negative Renal ROS  negative genitourinary   Musculoskeletal   Abdominal   Peds  Hematology negative hematology ROS (+)   Anesthesia Other Findings   Reproductive/Obstetrics negative OB ROS                            Anesthesia Physical Anesthesia Plan  ASA: II  Anesthesia Plan: General   Post-op Pain Management:    Induction: Intravenous  PONV Risk Score and Plan: 4 or greater and Ondansetron, Dexamethasone and Midazolam  Airway Management Planned: LMA  Additional Equipment:   Intra-op Plan:   Post-operative Plan: Extubation in OR  Informed Consent: I have reviewed the patients History and Physical, chart, labs and discussed the procedure including the risks, benefits and alternatives for the proposed anesthesia with the patient or authorized representative who has indicated his/her understanding and acceptance.   Dental advisory given  Plan Discussed with: CRNA  Anesthesia Plan Comments:         Anesthesia Quick Evaluation

## 2017-04-13 NOTE — Anesthesia Procedure Notes (Signed)
Procedure Name: LMA Insertion Date/Time: 04/13/2017 12:02 PM Performed by: Marny Lowensteinapozzi, Ayano W, CRNA Pre-anesthesia Checklist: Patient identified, Emergency Drugs available, Suction available, Patient being monitored and Timeout performed Patient Re-evaluated:Patient Re-evaluated prior to induction Oxygen Delivery Method: Circle system utilized Preoxygenation: Pre-oxygenation with 100% oxygen Induction Type: IV induction Ventilation: Mask ventilation without difficulty LMA: LMA inserted Placement Confirmation: positive ETCO2,  CO2 detector and breath sounds checked- equal and bilateral Tube secured with: Tape Dental Injury: Teeth and Oropharynx as per pre-operative assessment

## 2017-04-13 NOTE — Interval H&P Note (Signed)
History and Physical Interval Note:  04/13/2017 11:49 AM  Leah Armstrong  has presented today for surgery, with the diagnosis of RIGHT BREAST ABNORMAL MAMMOGRAM  The various methods of treatment have been discussed with the patient and family. After consideration of risks, benefits and other options for treatment, the patient has consented to  Procedure(s): RADIOACTIVE SEED GUIDED EXCISIONA RIGHT BREAST BIOPSY (Right) as a surgical intervention .  The patient's history has been reviewed, patient examined, no change in status, stable for surgery.  I have reviewed the patient's chart and labs.  Questions were answered to the patient's satisfaction.     Almond LintFaera Camala Talwar

## 2017-04-13 NOTE — Discharge Instructions (Addendum)
[facility] Office Phone Number [phone]  BREAST BIOPSY/ PARTIAL MASTECTOMY: POST OP INSTRUCTIONS  Always review your discharge instruction sheet given to you by the facility where your surgery was performed.  IF YOU HAVE DISABILITY OR FAMILY LEAVE FORMS, YOU MUST BRING THEM TO THE OFFICE FOR PROCESSING.  DO NOT GIVE THEM TO YOUR DOCTOR.  1. A prescription for pain medication may be given to you upon discharge.  Take your pain medication as prescribed, if needed.  If narcotic pain medicine is not needed, then you may take acetaminophen (Tylenol) or ibuprofen (Advil) as needed. Next dose of Tylenol due at 4:30pm. 2. Take your usually prescribed medications unless otherwise directed 3. If you need a refill on your pain medication, please contact your pharmacy.  They will contact our office to request authorization.  Prescriptions will not be filled after 5pm or on week-ends. 4. You should eat very light the first 24 hours after surgery, such as soup, crackers, pudding, etc.  Resume your normal diet the day after surgery. 5. Most patients will experience some swelling and bruising in the breast.  Ice packs and a good support bra will help.  Swelling and bruising can take several days to resolve.  6. It is common to experience some constipation if taking pain medication after surgery.  Increasing fluid intake and taking a stool softener will usually help or prevent this problem from occurring.  A mild laxative (Milk of Magnesia or Miralax) should be taken according to package directions if there are no bowel movements after 48 hours. 7. Unless discharge instructions indicate otherwise, you may remove your bandages 48 hours after surgery, and you may shower at that time.  You may have steri-strips (small skin tapes) in place directly over the incision.  These strips should be left on the skin for 7-10 days.   Any sutures or staples will be removed at the office during your follow-up  visit. 8. ACTIVITIES:  You may resume regular daily activities (gradually increasing) beginning the next day.  Wearing a good support bra or sports bra (or the breast binder) minimizes pain and swelling.  You may have sexual intercourse when it is comfortable. a. You may drive when you no longer are taking prescription pain medication, you can comfortably wear a seatbelt, and you can safely maneuver your car and apply brakes. b. RETURN TO WORK:  __________1 week_______________ 9. You should see your doctor in the office for a follow-up appointment approximately two weeks after your surgery.  Your doctors nurse will typically make your follow-up appointment when she calls you with your pathology report.  Expect your pathology report 2-3 business days after your surgery.  You may call to check if you do not hear from [place] after three days.   WHEN TO CALL YOUR DOCTOR: 1. Fever over 101.0 2. Nausea and/or vomiting. 3. Extreme swelling or bruising. 4. Continued bleeding from incision. 5. Increased pain, redness, or drainage from the incision.  The clinic staff is available to answer your questions during regular business hours.  Please dont hesitate to call and ask to speak to one of the nurses for clinical concerns.  If you have a medical emergency, go to the nearest emergency room or call 911.  A surgeon from [facility] is always on call at the hospital.  For further questions, please visit [email or web address]     Post Anesthesia Home Care Instructions  Activity: Get plenty of rest for the remainder of the day. A  responsible individual must stay with you for 24 hours following the procedure.  For the next 24 hours, DO NOT: -Drive a car -Advertising copywriterperate machinery -Drink alcoholic beverages -Take any medication unless instructed by your physician -Make any legal decisions or sign important papers.  Meals: Start with liquid foods such as gelatin or soup. Progress to regular  foods as tolerated. Avoid greasy, spicy, heavy foods. If nausea and/or vomiting occur, drink only clear liquids until the nausea and/or vomiting subsides. Call your physician if vomiting continues.  Special Instructions/Symptoms: Your throat may feel dry or sore from the anesthesia or the breathing tube placed in your throat during surgery. If this causes discomfort, gargle with warm salt water. The discomfort should disappear within 24 hours.  If you had a scopolamine patch placed behind your ear for the management of post- operative nausea and/or vomiting:  1. The medication in the patch is effective for 72 hours, after which it should be removed.  Wrap patch in a tissue and discard in the trash. Wash hands thoroughly with soap and water. 2. You may remove the patch earlier than 72 hours if you experience unpleasant side effects which may include dry mouth, dizziness or visual disturbances. 3. Avoid touching the patch. Wash your hands with soap and water after contact with the patch.

## 2017-04-14 NOTE — Anesthesia Postprocedure Evaluation (Signed)
Anesthesia Post Note  Patient: Leah MillardStephanie M Armstrong  Procedure(s) Performed: RADIOACTIVE SEED GUIDED EXCISIONAL BREAST BIOPSY (Right Breast)     Patient location during evaluation: PACU Anesthesia Type: General Level of consciousness: awake and alert Pain management: pain level controlled Vital Signs Assessment: post-procedure vital signs reviewed and stable Respiratory status: spontaneous breathing, nonlabored ventilation and respiratory function stable Cardiovascular status: blood pressure returned to baseline and stable Postop Assessment: no apparent nausea or vomiting Anesthetic complications: no    Last Vitals:  Vitals:   04/13/17 1400 04/13/17 1430  BP: 108/61 110/70  Pulse: 88 90  Resp: 16 18  Temp:  36.5 C  SpO2: 99% 100%    Last Pain:  Vitals:   04/13/17 1430  TempSrc:   PainSc: 0-No pain                 Octivia Canion,W. EDMOND

## 2017-04-14 NOTE — Progress Notes (Signed)
Please let patient know pathology is negative for cancer.

## 2017-09-03 ENCOUNTER — Encounter: Payer: Self-pay | Admitting: Gastroenterology

## 2017-09-04 ENCOUNTER — Encounter: Payer: Self-pay | Admitting: Gastroenterology

## 2017-10-12 DIAGNOSIS — R220 Localized swelling, mass and lump, head: Secondary | ICD-10-CM

## 2017-10-12 HISTORY — DX: Localized swelling, mass and lump, head: R22.0

## 2017-11-03 ENCOUNTER — Encounter: Payer: Self-pay | Admitting: Gastroenterology

## 2017-11-03 ENCOUNTER — Ambulatory Visit (AMBULATORY_SURGERY_CENTER): Payer: Self-pay

## 2017-11-03 VITALS — Ht 62.0 in | Wt 171.2 lb

## 2017-11-03 DIAGNOSIS — Z1211 Encounter for screening for malignant neoplasm of colon: Secondary | ICD-10-CM

## 2017-11-03 MED ORDER — NA SULFATE-K SULFATE-MG SULF 17.5-3.13-1.6 GM/177ML PO SOLN: 1.0000 | Freq: Once | ORAL | 0 refills | Status: AC

## 2017-11-03 NOTE — Progress Notes (Signed)
Per pt, no allergies to soy or egg products.Pt not taking any weight loss meds or using  O2 at home.  Pt refused emmi video.  During PV, pt states she gets sick on large amounts of bowel prep. Suprep instructions were given to pt.

## 2017-11-17 ENCOUNTER — Ambulatory Visit (AMBULATORY_SURGERY_CENTER): Payer: Managed Care, Other (non HMO) | Admitting: Gastroenterology

## 2017-11-17 ENCOUNTER — Encounter: Payer: Self-pay | Admitting: Gastroenterology

## 2017-11-17 VITALS — BP 119/80 | HR 78 | Temp 99.1°F | Resp 16 | Ht 62.0 in | Wt 171.0 lb

## 2017-11-17 DIAGNOSIS — Z1211 Encounter for screening for malignant neoplasm of colon: Secondary | ICD-10-CM | POA: Diagnosis not present

## 2017-11-17 DIAGNOSIS — K5730 Diverticulosis of large intestine without perforation or abscess without bleeding: Secondary | ICD-10-CM

## 2017-11-17 MED ORDER — SODIUM CHLORIDE 0.9 % IV SOLN: 500.0000 mL | Freq: Once | INTRAVENOUS | Status: DC

## 2017-11-17 NOTE — Patient Instructions (Signed)
Information on diverticulosis given to you today.  YOU HAD AN ENDOSCOPIC PROCEDURE TODAY AT THE [facility]:   Refer to the procedure report that was given to you for any specific questions about what was found during the examination.  If the procedure report does not answer your questions, please call your gastroenterologist to clarify.  If you requested that your care partner not be given the details of your procedure findings, then the procedure report has been included in a sealed envelope for you to review at your convenience later.  YOU SHOULD EXPECT: Some feelings of bloating in the abdomen. Passage of more gas than usual.  Walking can help get rid of the air that was put into your GI tract during the procedure and reduce the bloating. If you had a lower endoscopy (such as a colonoscopy or flexible sigmoidoscopy) you may notice spotting of blood in your stool or on the toilet paper. If you underwent a bowel prep for your procedure, you may not have a normal bowel movement for a few days.  Please Note:  You might notice some irritation and congestion in your nose or some drainage.  This is from the oxygen used during your procedure.  There is no need for concern and it should clear up in a day or so.  SYMPTOMS TO REPORT IMMEDIATELY:   Following lower endoscopy (colonoscopy or flexible sigmoidoscopy):  Excessive amounts of blood in the stool  Significant tenderness or worsening of abdominal pains  Swelling of the abdomen that is new, acute  Fever of 100F or higher   For urgent or emergent issues, a gastroenterologist can be reached at any hour by calling [phone].   DIET:  We do recommend a small meal at first, but then you may proceed to your regular diet.  Drink plenty of fluids but you should avoid alcoholic beverages for 24 hours.  ACTIVITY:  You should plan to take it easy for the rest of today and you should NOT DRIVE or use heavy machinery until tomorrow  (because of the sedation medicines used during the test).    FOLLOW UP: Our staff will call the number listed on your records the next business day following your procedure to check on you and address any questions or concerns that you may have regarding the information given to you following your procedure. If we do not reach you, we will leave a message.  However, if you are feeling well and you are not experiencing any problems, there is no need to return our call.  We will assume that you have returned to your regular daily activities without incident.  If any biopsies were taken you will be contacted by phone or by letter within the next 1-3 weeks.  Please call us at [phone] if you have not heard about the biopsies in 3 weeks.    SIGNATURES/CONFIDENTIALITY: You and/or your care partner have signed paperwork which will be entered into your electronic medical record.  These signatures attest to the fact that that the information above on your After Visit Summary has been reviewed and is understood.  Full responsibility of the confidentiality of this discharge information lies with you and/or your care-partner. 

## 2017-11-17 NOTE — Op Note (Signed)
[facility] Patient Name: Leah Armstrong Procedure Date: 11/17/2017 9:53 AM MRN: [id number] Endoscopist: Rachael Fee , MD Age: 52 Referring MD:  Date of Birth: 06-12-1965 Gender: Female Account #: [id number] Procedure:                Colonoscopy Indications:              Screening for colorectal malignant neoplasm Medicines:                Monitored Anesthesia Care Procedure:                Pre-Anesthesia Assessment:                           - Prior to the procedure, a History and Physical                            was performed, and patient medications and                            allergies were reviewed. The patient's tolerance of                            previous anesthesia was also reviewed. The risks                            and benefits of the procedure and the sedation                            options and risks were discussed with the patient.                            All questions were answered, and informed consent                            was obtained. Prior Anticoagulants: The patient has                            taken no previous anticoagulant or antiplatelet                            agents. ASA Grade Assessment: II - A patient with                            mild systemic disease. After reviewing the risks                            and benefits, the patient was deemed in                            satisfactory condition to undergo the procedure.                           After obtaining informed consent, the colonoscope  was passed under direct vision. Throughout the                            procedure, the patient's blood pressure, pulse, and                            oxygen saturations were monitored continuously. The                            Colonoscope was introduced through the anus and                            advanced to the the cecum, identified by                            appendiceal orifice and  ileocecal valve. The                            colonoscopy was performed without difficulty. The                            patient tolerated the procedure well. The quality                            of the bowel preparation was good. The ileocecal                            valve, appendiceal orifice, and rectum were                            photographed. Scope In: 10:00:03 AM Scope Out: 10:09:46 AM Scope Withdrawal Time: 0 hours 7 minutes 30 seconds  Total Procedure Duration: 0 hours 9 minutes 43 seconds  Findings:                 Multiple small and large-mouthed diverticula were                            found in the entire colon.                           The exam was otherwise without abnormality on                            direct and retroflexion views. Complications:            No immediate complications. Estimated blood loss:                            None. Estimated Blood Loss:     Estimated blood loss: none. Impression:               - Diverticulosis in the entire examined colon.                           - The examination was otherwise normal on direct  and retroflexion views.                           - No specimens collected. Recommendation:           - Patient has a contact number available for                            emergencies. The signs and symptoms of potential                            delayed complications were discussed with the                            patient. Return to normal activities tomorrow.                            Written discharge instructions were provided to the                            patient.                           - Resume previous diet.                           - Continue present medications.                           - Repeat colonoscopy in 10 years for screening.                            Ther is no need for colon cancer screening by any                            method prior to then. Rachael Fee, MD 11/17/2017 10:12:01 AM This report has been signed electronically.

## 2017-11-17 NOTE — Progress Notes (Signed)
Report given to PACU, vss 

## 2017-11-20 ENCOUNTER — Telehealth: Payer: Self-pay

## 2017-11-20 NOTE — Telephone Encounter (Signed)
  Follow up Call-  Call back number 11/17/2017 07/13/2015  Post procedure Call Back phone  # [phone]  Permission to leave phone message Yes Yes  Some recent data might be hidden     Patient questions:  Do you have a fever, pain , or abdominal swelling? No. Pain Score  0 *  Have you tolerated food without any problems? Yes.    Have you been able to return to your normal activities? Yes.    Do you have any questions about your discharge instructions: Diet   No. Medications  No. Follow up visit  No.  Do you have questions or concerns about your Care? No.  Actions: * If pain score is 4 or above: No action needed, pain <4.
